# Patient Record
Sex: Female | Born: 1990 | Race: White | Hispanic: No | Marital: Married | Smoking: Never smoker
Health system: Southern US, Community
[De-identification: ages and names within clinical notes are randomized; demographics above are authoritative.]

## PROBLEM LIST (undated history)

## (undated) ENCOUNTER — Inpatient Hospital Stay (HOSPITAL_COMMUNITY): Payer: Self-pay

## (undated) DIAGNOSIS — M419 Scoliosis, unspecified: Secondary | ICD-10-CM

## (undated) DIAGNOSIS — Z789 Other specified health status: Secondary | ICD-10-CM

## (undated) HISTORY — PX: OTHER SURGICAL HISTORY: SHX169

---

## 2013-10-04 LAB — OB RESULTS CONSOLE ABO/RH: RH Type: POSITIVE

## 2013-10-04 LAB — OB RESULTS CONSOLE RUBELLA ANTIBODY, IGM: RUBELLA: IMMUNE

## 2013-10-04 LAB — OB RESULTS CONSOLE GC/CHLAMYDIA
CHLAMYDIA, DNA PROBE: NEGATIVE
Gonorrhea: NEGATIVE

## 2013-10-04 LAB — OB RESULTS CONSOLE RPR: RPR: NONREACTIVE

## 2013-10-04 LAB — OB RESULTS CONSOLE HIV ANTIBODY (ROUTINE TESTING): HIV: NONREACTIVE

## 2013-10-04 LAB — OB RESULTS CONSOLE HEPATITIS B SURFACE ANTIGEN: Hepatitis B Surface Ag: NEGATIVE

## 2013-10-04 LAB — OB RESULTS CONSOLE ANTIBODY SCREEN: ANTIBODY SCREEN: NEGATIVE

## 2013-10-31 NOTE — L&D Delivery Note (Signed)
Delivery Note  SVD viable female Apgars 8,9 over intact perineum but small vaginal lac.  Placenta delivered spontaneously intact with 3VC. Repair with 3-0 Chromic with good support and hemostasis noted and R/V exam confirms.  PH art was sent.  [place] cord blood was not done.  Mother and baby were doing well.  EBL 300cc  Candice Campavid Lowe, MD

## 2014-03-31 LAB — OB RESULTS CONSOLE GBS: STREP GROUP B AG: NEGATIVE

## 2014-04-09 ENCOUNTER — Encounter (HOSPITAL_COMMUNITY): Payer: Self-pay | Admitting: *Deleted

## 2014-04-09 ENCOUNTER — Inpatient Hospital Stay (HOSPITAL_COMMUNITY)
Admission: AD | Admit: 2014-04-09 | Discharge: 2014-04-09 | Disposition: A | Discharge status: Home / Self Care | Payer: BC Managed Care – PPO | Source: Ambulatory Visit | Attending: Obstetrics and Gynecology | Admitting: Obstetrics and Gynecology

## 2014-04-09 DIAGNOSIS — O9989 Other specified diseases and conditions complicating pregnancy, childbirth and the puerperium: Principal | ICD-10-CM

## 2014-04-09 DIAGNOSIS — Z833 Family history of diabetes mellitus: Secondary | ICD-10-CM | POA: Insufficient documentation

## 2014-04-09 DIAGNOSIS — O99891 Other specified diseases and conditions complicating pregnancy: Secondary | ICD-10-CM | POA: Insufficient documentation

## 2014-04-09 DIAGNOSIS — IMO0001 Reserved for inherently not codable concepts without codable children: Secondary | ICD-10-CM

## 2014-04-09 DIAGNOSIS — Z87891 Personal history of nicotine dependence: Secondary | ICD-10-CM | POA: Insufficient documentation

## 2014-04-09 DIAGNOSIS — R030 Elevated blood-pressure reading, without diagnosis of hypertension: Secondary | ICD-10-CM | POA: Insufficient documentation

## 2014-04-09 HISTORY — DX: Other specified health status: Z78.9

## 2014-04-09 LAB — COMPREHENSIVE METABOLIC PANEL
ALBUMIN: 2.4 g/dL — AB (ref 3.5–5.2)
ALK PHOS: 131 U/L — AB (ref 39–117)
ALT: 27 U/L (ref 0–35)
AST: 25 U/L (ref 0–37)
BUN: 11 mg/dL (ref 6–23)
CHLORIDE: 103 meq/L (ref 96–112)
CO2: 23 mEq/L (ref 19–32)
Calcium: 9 mg/dL (ref 8.4–10.5)
Creatinine, Ser: 0.82 mg/dL (ref 0.50–1.10)
GFR calc Af Amer: >90 mL/min (ref >90)
GFR calc non Af Amer: >90 mL/min (ref >90)
Glucose, Bld: 78 mg/dL (ref 70–99)
POTASSIUM: 4 meq/L (ref 3.7–5.3)
SODIUM: 137 meq/L (ref 137–147)
TOTAL PROTEIN: 6.1 g/dL (ref 6.0–8.3)

## 2014-04-09 LAB — CBC
HEMATOCRIT: 33.6 % — AB (ref 36.0–46.0)
Hemoglobin: 11.5 g/dL — ABNORMAL LOW (ref 12.0–15.0)
MCH: 30 pg (ref 26.0–34.0)
MCHC: 34.2 g/dL (ref 30.0–36.0)
MCV: 87.7 fL (ref 78.0–100.0)
PLATELETS: 164 10*3/uL (ref 150–400)
RBC: 3.83 MIL/uL — ABNORMAL LOW (ref 3.87–5.11)
RDW: 12.5 % (ref 11.5–15.5)
WBC: 8.9 10*3/uL (ref 4.0–10.5)

## 2014-04-09 LAB — URINALYSIS, ROUTINE W REFLEX MICROSCOPIC
Bilirubin Urine: NEGATIVE
Glucose, UA: 100 mg/dL — AB
Hgb urine dipstick: NEGATIVE
KETONES UR: NEGATIVE mg/dL
LEUKOCYTES UA: NEGATIVE
Nitrite: NEGATIVE
PH: 6 (ref 5.0–8.0)
Protein, ur: NEGATIVE mg/dL
SPECIFIC GRAVITY, URINE: 1.01 (ref 1.005–1.030)
Urobilinogen, UA: 0.2 mg/dL (ref 0.0–1.0)

## 2014-04-09 LAB — PROTEIN / CREATININE RATIO, URINE
Creatinine, Urine: 104.37 mg/dL
PROTEIN CREATININE RATIO: 0.13 (ref 0.00–0.15)
TOTAL PROTEIN, URINE: 13.6 mg/dL

## 2014-04-09 LAB — LACTATE DEHYDROGENASE: LDH: 227 U/L (ref 94–250)

## 2014-04-09 LAB — URIC ACID: Uric Acid, Serum: 5.5 mg/dL (ref 2.4–7.0)

## 2014-04-09 NOTE — MAU Provider Note (Signed)
History     CSN: [id number]  Arrival date and time: 04/09/14 1622   First Provider Initiated Contact with Patient 04/09/14 1747      Chief Complaint  Patient presents with  . Hypertension   HPI Ms. Lindsay West is a 23 y.o. G2P0 at [redacted]w[redacted]d who presents to MAU today from the office for further evaluation of elevated BP. The patient states BP in the office was 120s/90. She states very mild headache. She denies blurred vision, floaters, RUQ pain, vaginal bleeding or LOF. She states mild LE edema and good fetal movement  OB History   Grav Para Term Preterm Abortions TAB SAB Ect Mult Living   2               Past Medical History  Diagnosis Date  . Medical history non-contributory     Past Surgical History  Procedure Laterality Date  . No past surgeries      Family History  Problem Relation Age of Onset  . Diabetes Paternal Grandmother     History  Substance Use Topics  . Smoking status: Not on file  . Smokeless tobacco: Not on file  . Alcohol Use: Not on file    Allergies: No Known Allergies  Prescriptions prior to admission  Medication Sig Dispense Refill  . acetaminophen (TYLENOL) 325 MG tablet Take 650 mg by mouth every 6 (six) hours as needed for headache.      . ondansetron (ZOFRAN) 8 MG tablet Take 4 mg by mouth every 8 (eight) hours as needed for nausea or vomiting. Patient states that she takes 1/2 tablet of 8 mg dose at times.        Review of Systems  Eyes: Negative for blurred vision.  Gastrointestinal: Negative for abdominal pain.  Genitourinary:       Neg - vaginal bleeding, discharge, LOF  Neurological: Positive for headaches.   Physical Exam   Blood pressure 126/88, pulse 94, last menstrual period 07/08/2013.  Physical Exam  Constitutional: She is oriented to person, place, and time. She appears well-developed and well-nourished. She appears distressed.  HENT:  Head: Normocephalic and atraumatic.  Cardiovascular: Normal rate, regular rhythm  and normal heart sounds.   Respiratory: Effort normal and breath sounds normal. No respiratory distress.  GI: Soft. She exhibits no distension and no mass. There is tenderness (mild diffuse tenderness to palpation). There is no rebound and no guarding.  Musculoskeletal: She exhibits edema (mild non-pitting edema bilaterally).  Neurological: She is alert and oriented to person, place, and time. She has normal reflexes.  No clonus  Skin: Skin is warm and dry. No erythema.  Psychiatric: She has a normal mood and affect.   Results for orders placed during the hospital encounter of 04/09/14 (from the past 24 hour(s))  URINALYSIS, ROUTINE W REFLEX MICROSCOPIC     Status: Abnormal   Collection Time    04/09/14  4:45 PM      Result Value Ref Range   Color, Urine YELLOW  YELLOW   APPearance CLEAR  CLEAR   Specific Gravity, Urine 1.010  1.005 - 1.030   pH 6.0  5.0 - 8.0   Glucose, UA 100 (*) NEGATIVE mg/dL   Hgb urine dipstick NEGATIVE  NEGATIVE   Bilirubin Urine NEGATIVE  NEGATIVE   Ketones, ur NEGATIVE  NEGATIVE mg/dL   Protein, ur NEGATIVE  NEGATIVE mg/dL   Urobilinogen, UA 0.2  0.0 - 1.0 mg/dL   Nitrite NEGATIVE  NEGATIVE   Leukocytes, UA NEGATIVE  NEGATIVE  PROTEIN / CREATININE RATIO, URINE     Status: None   Collection Time    04/09/14  4:45 PM      Result Value Ref Range   Creatinine, Urine 104.37     Total Protein, Urine 13.6     PROTEIN CREATININE RATIO 0.13  0.00 - 0.15  CBC     Status: Abnormal   Collection Time    04/09/14  5:10 PM      Result Value Ref Range   WBC 8.9  4.0 - 10.5 K/uL   RBC 3.83 (*) 3.87 - 5.11 MIL/uL   Hemoglobin 11.5 (*) 12.0 - 15.0 g/dL   HCT 08.633.6 (*) 57.836.0 - 46.946.0 %   MCV 87.7  78.0 - 100.0 fL   MCH 30.0  26.0 - 34.0 pg   MCHC 34.2  30.0 - 36.0 g/dL   RDW 62.912.5  52.811.5 - 41.315.5 %   Platelets 164  150 - 400 K/uL  COMPREHENSIVE METABOLIC PANEL     Status: Abnormal   Collection Time    04/09/14  5:10 PM      Result Value Ref Range   Sodium 137  137 -  147 mEq/L   Potassium 4.0  3.7 - 5.3 mEq/L   Chloride 103  96 - 112 mEq/L   CO2 23  19 - 32 mEq/L   Glucose, Bld 78  70 - 99 mg/dL   BUN 11  6 - 23 mg/dL   Creatinine, Ser 2.440.82  0.50 - 1.10 mg/dL   Calcium 9.0  8.4 - 01.010.5 mg/dL   Total Protein 6.1  6.0 - 8.3 g/dL   Albumin 2.4 (*) 3.5 - 5.2 g/dL   AST 25  0 - 37 U/L   ALT 27  0 - 35 U/L   Alkaline Phosphatase 131 (*) 39 - 117 U/L   Total Bilirubin <0.2 (*) 0.3 - 1.2 mg/dL   GFR calc non Af Amer >90  >90 mL/min   GFR calc Af Amer >90  >90 mL/min  URIC ACID     Status: None   Collection Time    04/09/14  5:10 PM      Result Value Ref Range   Uric Acid, Serum 5.5  2.4 - 7.0 mg/dL  LACTATE DEHYDROGENASE     Status: None   Collection Time    04/09/14  5:10 PM      Result Value Ref Range   LDH 227  94 - 250 U/L    Patient Vitals for the past 24 hrs:  BP Pulse  04/09/14 1747 126/88 mmHg 94  04/09/14 1732 127/73 mmHg 76  04/09/14 1717 130/86 mmHg 74  04/09/14 1703 135/78 mmHg 79  04/09/14 1701 134/90 mmHg 76    MAU Course  Procedures None  MDM Dr. Henderson Cloudomblin in MAU   Assessment and Plan  A: SIUP at 8034w2d Mildly elevated BP in pregnancy  P: Dr. Henderson Cloudomblin in MAU, discharged patient home  Freddi StarrJulie N Ethier, PA-C  04/09/2014, 6:15 PM

## 2014-04-09 NOTE — Progress Notes (Signed)
Pt states she has headache that she rates about 3-4 . Pt states she had this type of headache in her first trimester, and "it kinda came back"

## 2014-04-09 NOTE — MAU Note (Signed)
Pt was seen in the MD's office, pt was sent over for further evaluation

## 2014-04-09 NOTE — Discharge Instructions (Signed)
Hypertension During Pregnancy °Hypertension is also called high blood pressure. Blood pressure moves blood in your body. Sometimes, the force that moves the blood becomes too strong. When you are pregnant, this condition should be watched carefully. It can cause problems for you and your baby. °HOME CARE  °· Make and keep all of your doctor visits. °· Take medicine as told by your doctor. Tell your doctor about all medicines you take. °· Eat very little salt. °· Exercise regularly. °· Do not drink alcohol. °· Do not smoke. °· Do not have drinks with caffeine. °· Lie on your left side when resting. °GET HELP RIGHT AWAY IF: °· You have bad belly (abdominal) pain. °· You have sudden puffiness (swelling) in the hands, ankles, or face. °· You gain 4 pounds (1.8 kilograms) or more in 1 week. °· You throw up (vomit) repeatedly. °· You have bleeding from the vagina. °· You do not feel the baby moving as much. °· You have a headache. °· You have blurred or double vision. °· You have muscle twitching or spasms. °· You have shortness of breath. °· You have blue fingernails and lips. °· You have blood in your pee (urine). °MAKE SURE YOU: °· Understand these instructions. °· Will watch your condition. °· Will get help right away if you are not doing well or get worse. °Document Released: 11/19/2010 Document Revised: 08/07/2013 Document Reviewed: 05/16/2013 °[employer]® Patient Information ©2014 [employer]. ° °

## 2014-04-09 NOTE — Progress Notes (Signed)
In office today BP up some and some protein in urine. BPP in office 6/8. Today no HA or vision change, no epigastric pain.  Filed Vitals:   04/09/14 1747  BP: 126/88  Pulse: 94    FHT reactive    (BPP = 8/10)  Results for orders placed during the hospital encounter of 04/09/14 (from the past 24 hour(s))  URINALYSIS, ROUTINE W REFLEX MICROSCOPIC     Status: Abnormal   Collection Time    04/09/14  4:45 PM      Result Value Ref Range   Color, Urine YELLOW  YELLOW   APPearance CLEAR  CLEAR   Specific Gravity, Urine 1.010  1.005 - 1.030   pH 6.0  5.0 - 8.0   Glucose, UA 100 (*) NEGATIVE mg/dL   Hgb urine dipstick NEGATIVE  NEGATIVE   Bilirubin Urine NEGATIVE  NEGATIVE   Ketones, ur NEGATIVE  NEGATIVE mg/dL   Protein, ur NEGATIVE  NEGATIVE mg/dL   Urobilinogen, UA 0.2  0.0 - 1.0 mg/dL   Nitrite NEGATIVE  NEGATIVE   Leukocytes, UA NEGATIVE  NEGATIVE  PROTEIN / CREATININE RATIO, URINE     Status: None   Collection Time    04/09/14  4:45 PM      Result Value Ref Range   Creatinine, Urine 104.37     Total Protein, Urine 13.6     PROTEIN CREATININE RATIO 0.13  0.00 - 0.15  CBC     Status: Abnormal   Collection Time    04/09/14  5:10 PM      Result Value Ref Range   WBC 8.9  4.0 - 10.5 K/uL   RBC 3.83 (*) 3.87 - 5.11 MIL/uL   Hemoglobin 11.5 (*) 12.0 - 15.0 g/dL   HCT 81.133.6 (*) 91.436.0 - 78.246.0 %   MCV 87.7  78.0 - 100.0 fL   MCH 30.0  26.0 - 34.0 pg   MCHC 34.2  30.0 - 36.0 g/dL   RDW 95.612.5  21.311.5 - 08.615.5 %   Platelets 164  150 - 400 K/uL  COMPREHENSIVE METABOLIC PANEL     Status: Abnormal   Collection Time    04/09/14  5:10 PM      Result Value Ref Range   Sodium 137  137 - 147 mEq/L   Potassium 4.0  3.7 - 5.3 mEq/L   Chloride 103  96 - 112 mEq/L   CO2 23  19 - 32 mEq/L   Glucose, Bld 78  70 - 99 mg/dL   BUN 11  6 - 23 mg/dL   Creatinine, Ser 5.780.82  0.50 - 1.10 mg/dL   Calcium 9.0  8.4 - 46.910.5 mg/dL   Total Protein 6.1  6.0 - 8.3 g/dL   Albumin 2.4 (*) 3.5 - 5.2 g/dL   AST 25   0 - 37 U/L   ALT 27  0 - 35 U/L   Alkaline Phosphatase 131 (*) 39 - 117 U/L   Total Bilirubin <0.2 (*) 0.3 - 1.2 mg/dL   GFR calc non Af Amer >90  >90 mL/min   GFR calc Af Amer >90  >90 mL/min  URIC ACID     Status: None   Collection Time    04/09/14  5:10 PM      Result Value Ref Range   Uric Acid, Serum 5.5  2.4 - 7.0 mg/dL  LACTATE DEHYDROGENASE     Status: None   Collection Time    04/09/14  5:10 PM  Result Value Ref Range   LDH 227  94 - 250 U/L    A: Satisfactory  P: Preeclampsia warnings given      Rest encouraged      FU office 5-6 days

## 2014-04-11 ENCOUNTER — Encounter (HOSPITAL_COMMUNITY): Payer: Self-pay | Admitting: *Deleted

## 2014-04-11 ENCOUNTER — Inpatient Hospital Stay (HOSPITAL_COMMUNITY)
Admission: AD | Admit: 2014-04-11 | Discharge: 2014-04-11 | Disposition: A | Discharge status: Home / Self Care | Payer: BC Managed Care – PPO | Source: Ambulatory Visit | Attending: Obstetrics and Gynecology | Admitting: Obstetrics and Gynecology

## 2014-04-11 DIAGNOSIS — O133 Gestational [pregnancy-induced] hypertension without significant proteinuria, third trimester: Secondary | ICD-10-CM

## 2014-04-11 DIAGNOSIS — Z833 Family history of diabetes mellitus: Secondary | ICD-10-CM | POA: Insufficient documentation

## 2014-04-11 DIAGNOSIS — O139 Gestational [pregnancy-induced] hypertension without significant proteinuria, unspecified trimester: Secondary | ICD-10-CM | POA: Insufficient documentation

## 2014-04-11 DIAGNOSIS — O26893 Other specified pregnancy related conditions, third trimester: Secondary | ICD-10-CM

## 2014-04-11 DIAGNOSIS — R51 Headache: Secondary | ICD-10-CM

## 2014-04-11 LAB — URINALYSIS, ROUTINE W REFLEX MICROSCOPIC
BILIRUBIN URINE: NEGATIVE
GLUCOSE, UA: 250 mg/dL — AB
HGB URINE DIPSTICK: NEGATIVE
Ketones, ur: NEGATIVE mg/dL
Leukocytes, UA: NEGATIVE
Nitrite: NEGATIVE
PH: 6 (ref 5.0–8.0)
Protein, ur: NEGATIVE mg/dL
SPECIFIC GRAVITY, URINE: 1.02 (ref 1.005–1.030)
Urobilinogen, UA: 0.2 mg/dL (ref 0.0–1.0)

## 2014-04-11 NOTE — MAU Note (Signed)
Pt states here for eval of headache and blurred vision. Was seen in MAU Wednesday for PIH eval.

## 2014-04-11 NOTE — MAU Provider Note (Signed)
History     CSN: [id number]  Arrival date and time: 04/11/14 1458   None     Chief Complaint  Patient presents with  . Headache  . Blurred Vision   HPI Lindsay West is 23 y.o. G1P0 6846w2d weeks --called office with complaint of headache preceded by blurred vision.  This is her first pregnancy.  She is a patient of Dr. Lisbeth PlyMcComb's.  Pregnancy was without complication until 2 days ago at her scheduled appt she had swelling of her feet and  her BP was elevated.  Sent here for PIH eval.  Labs were wnl.  She was instructed to call for sxs of PIH.  Headache and blurred vision occurred yesterday too.  At this time the headache is less since taking Tylenol 2 tabs at 1 oclock.  No further blurred vision.  When these sxs occur they last about 1 1/2 hrs.  Denies chest pain, vaginal bleeding, LOF.    Past Medical History  Diagnosis Date  . Medical history non-contributory     Past Surgical History  Procedure Laterality Date  . No past surgeries      Family History  Problem Relation Age of Onset  . Diabetes Paternal Grandmother     History  Substance Use Topics  . Smoking status: Never Smoker   . Smokeless tobacco: Not on file  . Alcohol Use: No    Allergies: No Known Allergies  Prescriptions prior to admission  Medication Sig Dispense Refill  . acetaminophen (TYLENOL) 325 MG tablet Take 650 mg by mouth every 6 (six) hours as needed for headache.      . ondansetron (ZOFRAN) 8 MG tablet Take 4 mg by mouth every 8 (eight) hours as needed for nausea or vomiting. Patient states that she takes 1/2 tablet of 8 mg dose at times.        Review of Systems  Cardiovascular:       Mild swelling in feet  Gastrointestinal: Negative for abdominal pain.  Genitourinary:       Neg for vaginal bleeding, loss of fluid  Neurological: Positive for headaches.   Physical Exam   Blood pressure 132/88, pulse 78, resp. rate 18, height 5\' 6"  (1.676 m), weight 183 lb 8 oz (83.235 kg), last menstrual period  07/08/2013.  Physical Exam  Constitutional: She is oriented to person, place, and time. She appears well-developed and well-nourished. No distress.  Musculoskeletal: She exhibits edema (mild swelling).  Neurological: She is alert and oriented to person, place, and time. She displays normal reflexes. Coordination normal.  Skin: Skin is warm and dry.  Psychiatric: She has a normal mood and affect. Her behavior is normal.   Results for orders placed during the hospital encounter of 04/11/14 (from the past 24 hour(s))  URINALYSIS, ROUTINE W REFLEX MICROSCOPIC     Status: Abnormal   Collection Time    04/11/14  3:12 PM      Result Value Ref Range   Color, Urine YELLOW  YELLOW   APPearance CLEAR  CLEAR   Specific Gravity, Urine 1.020  1.005 - 1.030   pH 6.0  5.0 - 8.0   Glucose, UA 250 (*) NEGATIVE mg/dL   Hgb urine dipstick NEGATIVE  NEGATIVE   Bilirubin Urine NEGATIVE  NEGATIVE   Ketones, ur NEGATIVE  NEGATIVE mg/dL   Protein, ur NEGATIVE  NEGATIVE mg/dL   Urobilinogen, UA 0.2  0.0 - 1.0 mg/dL   Nitrite NEGATIVE  NEGATIVE   Leukocytes, UA NEGATIVE  NEGATIVE  MAU Course  Procedures   NST baseline 130, REACTIVE,  Neg for contractions.  MDM Reported MSE, urine, and NST to Dr. Marcelle OverlieHolland.  Patient's labs were negative on 6/10.  Per Dr. Marcelle OverlieHolland, labs do not need to be repeated. Instruct patient to rest over the weekend, stay well hydrated, and keep scheduled appointment.  To call MD if sxs worsen.  Her husband has been taking BPs at home--will report to MD on call if they are elevated  Assessment and Plan  A:  Headache at 4632w2d gestation      Gestational hypertension      Reactive NST  P:  Patient was instructed to rest over the weekend, stay well hydrated and keep scheduled appointment for Monday.  She is to call for worsening sxs   Kinaya Hilliker,EVE M 04/11/2014, 3:30 PM

## 2014-04-11 NOTE — Discharge Instructions (Signed)
Third Trimester of Pregnancy °The third trimester is from week 29 through week 42, months 7 through 9. The third trimester is a time when the fetus is growing rapidly. At the end of the ninth month, the fetus is about 20 inches in length and weighs 6 10 pounds.  °BODY CHANGES °Your body goes through many changes during pregnancy. The changes vary from woman to woman.  °· Your weight will continue to increase. You can expect to gain 25 35 pounds (11 16 kg) by the end of the pregnancy. °· You may begin to get stretch marks on your hips, abdomen, and breasts. °· You may urinate more often because the fetus is moving lower into your pelvis and pressing on your bladder. °· You may develop or continue to have heartburn as a result of your pregnancy. °· You may develop constipation because certain hormones are causing the muscles that push waste through your intestines to slow down. °· You may develop hemorrhoids or swollen, bulging veins (varicose veins). °· You may have pelvic pain because of the weight gain and pregnancy hormones relaxing your joints between the bones in your pelvis. Back aches may result from over exertion of the muscles supporting your posture. °· Your breasts will continue to grow and be tender. A yellow discharge may leak from your breasts called colostrum. °· Your belly button may stick out. °· You may feel short of breath because of your expanding uterus. °· You may notice the fetus "dropping," or moving lower in your abdomen. °· You may have a bloody mucus discharge. This usually occurs a few days to a week before labor begins. °· Your cervix becomes thin and soft (effaced) near your due date. °WHAT TO EXPECT AT YOUR PRENATAL EXAMS  °You will have prenatal exams every 2 weeks until week 36. Then, you will have weekly prenatal exams. During a routine prenatal visit: °· You will be weighed to make sure you and the fetus are growing normally. °· Your blood pressure is taken. °· Your abdomen will be  measured to track your baby's growth. °· The fetal heartbeat will be listened to. °· Any test results from the previous visit will be discussed. °· You may have a cervical check near your due date to see if you have effaced. °At around 36 weeks, your caregiver will check your cervix. At the same time, your caregiver will also perform a test on the secretions of the vaginal tissue. This test is to determine if a type of bacteria, Group B streptococcus, is present. Your caregiver will explain this further. °Your caregiver may ask you: °· What your birth plan is. °· How you are feeling. °· If you are feeling the baby move. °· If you have had any abnormal symptoms, such as leaking fluid, bleeding, severe headaches, or abdominal cramping. °· If you have any questions. °Other tests or screenings that may be performed during your third trimester include: °· Blood tests that check for low iron levels (anemia). °· Fetal testing to check the health, activity level, and growth of the fetus. Testing is done if you have certain medical conditions or if there are problems during the pregnancy. °FALSE LABOR °You may feel small, irregular contractions that eventually go away. These are called Braxton Hicks contractions, or false labor. Contractions may last for hours, days, or even weeks before true labor sets in. If contractions come at regular intervals, intensify, or become painful, it is best to be seen by your caregiver.  °  SIGNS OF LABOR  °· Menstrual-like cramps. °· Contractions that are 5 minutes apart or less. °· Contractions that start on the top of the uterus and spread down to the lower abdomen and back. °· A sense of increased pelvic pressure or back pain. °· A watery or bloody mucus discharge that comes from the vagina. °If you have any of these signs before the 37th week of pregnancy, call your caregiver right away. You need to go to the hospital to get checked immediately. °HOME CARE INSTRUCTIONS  °· Avoid all  smoking, herbs, alcohol, and unprescribed drugs. These chemicals affect the formation and growth of the baby. °· Follow your caregiver's instructions regarding medicine use. There are medicines that are either safe or unsafe to take during pregnancy. °· Exercise only as directed by your caregiver. Experiencing uterine cramps is a good sign to stop exercising. °· Continue to eat regular, healthy meals. °· Wear a good support bra for breast tenderness. °· Do not use hot tubs, steam rooms, or saunas. °· Wear your seat belt at all times when driving. °· Avoid raw meat, uncooked cheese, cat litter boxes, and soil used by cats. These carry germs that can cause birth defects in the baby. °· Take your prenatal vitamins. °· Try taking a stool softener (if your caregiver approves) if you develop constipation. Eat more high-fiber foods, such as fresh vegetables or fruit and whole grains. Drink plenty of fluids to keep your urine clear or pale yellow. °· Take warm sitz baths to soothe any pain or discomfort caused by hemorrhoids. Use hemorrhoid cream if your caregiver approves. °· If you develop varicose veins, wear support hose. Elevate your feet for 15 minutes, 3 4 times a day. Limit salt in your diet. °· Avoid heavy lifting, wear low heal shoes, and practice good posture. °· Rest a lot with your legs elevated if you have leg cramps or low back pain. °· Visit your dentist if you have not gone during your pregnancy. Use a soft toothbrush to brush your teeth and be gentle when you floss. °· A sexual relationship may be continued unless your caregiver directs you otherwise. °· Do not travel far distances unless it is absolutely necessary and only with the approval of your caregiver. °· Take prenatal classes to understand, practice, and ask questions about the labor and delivery. °· Make a trial run to the hospital. °· Pack your hospital bag. °· Prepare the baby's nursery. °· Continue to go to all your prenatal visits as directed  by your caregiver. °SEEK MEDICAL CARE IF: °· You are unsure if you are in labor or if your water has broken. °· You have dizziness. °· You have mild pelvic cramps, pelvic pressure, or nagging pain in your abdominal area. °· You have persistent nausea, vomiting, or diarrhea. °· You have a bad smelling vaginal discharge. °· You have pain with urination. °SEEK IMMEDIATE MEDICAL CARE IF:  °· You have a fever. °· You are leaking fluid from your vagina. °· You have spotting or bleeding from your vagina. °· You have severe abdominal cramping or pain. °· You have rapid weight loss or gain. °· You have shortness of breath with chest pain. °· You notice sudden or extreme swelling of your face, hands, ankles, feet, or legs. °· You have not felt your baby move in over an hour. °· You have severe headaches that do not go away with medicine. °· You have vision changes. °Document Released: 10/11/2001 Document Revised: 06/19/2013 Document Reviewed:   12/18/2012 °ExitCare® Patient Information ©2014 [employer]. °Fetal Movement Counts °Patient Name: __________________________________________________ Patient Due Date: ____________________ °Performing a fetal movement count is highly recommended in high-risk pregnancies, but it is good for every pregnant woman to do. Your caregiver may ask you to start counting fetal movements at 28 weeks of the pregnancy. Fetal movements often increase: °· After eating a full meal. °· After physical activity. °· After eating or drinking something sweet or cold. °· At rest. °Pay attention to when you feel the baby is most active. This will help you notice a pattern of your baby's sleep and wake cycles and what factors contribute to an increase in fetal movement. It is important to perform a fetal movement count at the same time each day when your baby is normally most active.  °HOW TO COUNT FETAL MOVEMENTS °1. Find a quiet and comfortable area to sit or lie down on your left side. Lying on your left  side provides the best blood and oxygen circulation to your baby. °2. Write down the day and time on a sheet of paper or in a journal. °3. Start counting kicks, flutters, swishes, rolls, or jabs in a 2 hour period. You should feel at least 10 movements within 2 hours. °4. If you do not feel 10 movements in 2 hours, wait 2 3 hours and count again. Look for a change in the pattern or not enough counts in 2 hours. °SEEK MEDICAL CARE IF: °· You feel less than 10 counts in 2 hours, tried twice. °· There is no movement in over an hour. °· The pattern is changing or taking longer each day to reach 10 counts in 2 hours. °· You feel the baby is not moving as he or she usually does. °Date: ____________ Movements: ____________ Start time: ____________ Finish time: ____________  °Date: ____________ Movements: ____________ Start time: ____________ Finish time: ____________ °Date: ____________ Movements: ____________ Start time: ____________ Finish time: ____________ °Date: ____________ Movements: ____________ Start time: ____________ Finish time: ____________ °Date: ____________ Movements: ____________ Start time: ____________ Finish time: ____________ °Date: ____________ Movements: ____________ Start time: ____________ Finish time: ____________ °Date: ____________ Movements: ____________ Start time: ____________ Finish time: ____________ °Date: ____________ Movements: ____________ Start time: ____________ Finish time: ____________  °Date: ____________ Movements: ____________ Start time: ____________ Finish time: ____________ °Date: ____________ Movements: ____________ Start time: ____________ Finish time: ____________ °Date: ____________ Movements: ____________ Start time: ____________ Finish time: ____________ °Date: ____________ Movements: ____________ Start time: ____________ Finish time: ____________ °Date: ____________ Movements: ____________ Start time: ____________ Finish time: ____________ °Date: ____________ Movements:  ____________ Start time: ____________ Finish time: ____________ °Date: ____________ Movements: ____________ Start time: ____________ Finish time: ____________  °Date: ____________ Movements: ____________ Start time: ____________ Finish time: ____________ °Date: ____________ Movements: ____________ Start time: ____________ Finish time: ____________ °Date: ____________ Movements: ____________ Start time: ____________ Finish time: ____________ °Date: ____________ Movements: ____________ Start time: ____________ Finish time: ____________ °Date: ____________ Movements: ____________ Start time: ____________ Finish time: ____________ °Date: ____________ Movements: ____________ Start time: ____________ Finish time: ____________ °Date: ____________ Movements: ____________ Start time: ____________ Finish time: ____________  °Date: ____________ Movements: ____________ Start time: ____________ Finish time: ____________ °Date: ____________ Movements: ____________ Start time: ____________ Finish time: ____________ °Date: ____________ Movements: ____________ Start time: ____________ Finish time: ____________ °Date: ____________ Movements: ____________ Start time: ____________ Finish time: ____________ °Date: ____________ Movements: ____________ Start time: ____________ Finish time: ____________ °Date: ____________ Movements: ____________ Start time: ____________ Finish time: ____________ °Date: ____________ Movements: ____________ Start time: ____________ Finish time: ____________  °Date:   ____________ Movements: ____________ Start time: ____________ Doreatha MartinFinish time: ____________ Date: ____________ Movements: ____________ Start time: ____________ Doreatha MartinFinish time: ____________ Date: ____________ Movements: ____________ Start time: ____________ Doreatha MartinFinish time: ____________ Date: ____________ Movements: ____________ Start time: ____________ Doreatha MartinFinish time: ____________ Date: ____________ Movements: ____________ Start time: ____________ Doreatha MartinFinish  time: ____________ Date: ____________ Movements: ____________ Start time: ____________ Doreatha MartinFinish time: ____________ Date: ____________ Movements: ____________ Start time: ____________ Doreatha MartinFinish time: ____________  Date: ____________ Movements: ____________ Start time: ____________ Doreatha MartinFinish time: ____________ Date: ____________ Movements: ____________ Start time: ____________ Doreatha MartinFinish time: ____________ Date: ____________ Movements: ____________ Start time: ____________ Doreatha MartinFinish time: ____________ Date: ____________ Movements: ____________ Start time: ____________ Doreatha MartinFinish time: ____________ Date: ____________ Movements: ____________ Start time: ____________ Doreatha MartinFinish time: ____________ Date: ____________ Movements: ____________ Start time: ____________ Doreatha MartinFinish time: ____________ Date: ____________ Movements: ____________ Start time: ____________ Doreatha MartinFinish time: ____________  Date: ____________ Movements: ____________ Start time: ____________ Doreatha MartinFinish time: ____________ Date: ____________ Movements: ____________ Start time: ____________ Doreatha MartinFinish time: ____________ Date: ____________ Movements: ____________ Start time: ____________ Doreatha MartinFinish time: ____________ Date: ____________ Movements: ____________ Start time: ____________ Doreatha MartinFinish time: ____________ Date: ____________ Movements: ____________ Start time: ____________ Doreatha MartinFinish time: ____________ Date: ____________ Movements: ____________ Start time: ____________ Doreatha MartinFinish time: ____________ Date: ____________ Movements: ____________ Start time: ____________ Doreatha MartinFinish time: ____________  Date: ____________ Movements: ____________ Start time: ____________ Doreatha MartinFinish time: ____________ Date: ____________ Movements: ____________ Start time: ____________ Doreatha MartinFinish time: ____________ Date: ____________ Movements: ____________ Start time: ____________ Doreatha MartinFinish time: ____________ Date: ____________ Movements: ____________ Start time: ____________ Doreatha MartinFinish time: ____________ Date: ____________  Movements: ____________ Start time: ____________ Doreatha MartinFinish time: ____________ Date: ____________ Movements: ____________ Start time: ____________ Doreatha MartinFinish time: ____________ Document Released: 11/16/2006 Document Revised: 10/03/2012 Document Reviewed: 08/13/2012 [employer] Patient Information 2014 [employer]. Hypertension During Pregnancy Hypertension is also called high blood pressure. It can occur at any time in life and during pregnancy. When you have hypertension, there is extra pressure inside your blood vessels that carry blood from the heart to the rest of your body (arteries). Hypertension during pregnancy can cause problems for you and your baby. Your baby might not weigh as much as it should at birth or might be born early (premature). Very bad cases of hypertension during pregnancy can be life threatening.  Different types of hypertension can occur during pregnancy.   Chronic hypertension. This happens when a woman has hypertension before pregnancy and it continues during pregnancy.  Gestational hypertension. This is when hypertension develops during pregnancy.  Preeclampsia or toxemia of pregnancy. This is a very serious type of hypertension that develops only during pregnancy. It is a disease that affects the whole body (systemic) and can be very dangerous for both mother and baby.  Gestational hypertension and preeclampsia usually go away after your baby is born. Blood pressure generally stabilizes within 6 weeks. Women who have hypertension during pregnancy have a greater chance of developing hypertension later in life or with future pregnancies. RISK FACTORS Some factors make you more likely to develop hypertension during pregnancy. Risk factors include:  Having hypertension before pregnancy.  Having hypertension during a previous pregnancy.  Being overweight.  Being older than 40.  Being pregnant with more than one baby (multiples).  Having diabetes or kidney  problems. SIGNS AND SYMPTOMS Chronic and gestational hypertension rarely cause symptoms. Preeclampsia has symptoms, which may include:  Increased protein in your urine. Your health care provider will check for this at every prenatal visit.  Swelling of your hands and face.  Rapid weight gain.  Headaches.  Visual changes.  Being bothered by light.  Abdominal  pain, especially in the right upper area.  Chest pain.  Shortness of breath.  Increased reflexes.  Seizures. Seizures occur with a more severe form of preeclampsia, called eclampsia. DIAGNOSIS   You may be diagnosed with hypertension during a regular prenatal exam. At each visit, tests may include:  Blood pressure checks.  A urine test to check for protein in your urine.  The type of hypertension you are diagnosed with depends on when you developed it. It also depends on your specific blood pressure reading.  Developing hypertension before 20 weeks of pregnancy is consistent with chronic hypertension.  Developing hypertension after 20 weeks of pregnancy is consistent with gestational hypertension.  Hypertension with increased urinary protein is diagnosed as preeclampsia.  Blood pressure measurements that stay above 160 systolic or 110 diastolic are a sign of severe preeclampsia. TREATMENT Treatment for hypertension during pregnancy varies. Treatment depends on the type of hypertension and how serious it is.  If you take medicine for chronic hypertension, you may need to switch medicines.  Drugs called ACE inhibitors should not be taken during pregnancy.  Low-dose aspirin may be suggested for women who have risk factors for preeclampsia.  If you have gestational hypertension, you may need to take a blood pressure medicine that is safe during pregnancy. Your health care provider will recommend the appropriate medicine.  If you have severe preeclampsia, you may need to be in the hospital. Health care providers  will watch you and the baby very closely. You also may need to take medicine (magnesium sulfate) to prevent seizures and lower blood pressure.  Sometimes an early delivery is needed. This may be the case if the condition worsens. It would be done to protect you and the baby. The only cure for preeclampsia is delivery. HOME CARE INSTRUCTIONS  Schedule and keep all of your regular appointments for prenatal care.  Only take over-the-counter or prescription medicines as directed by your health care provider. Tell your health care provider about all medicines you take.  Eat as little salt as possible.  Get regular exercise.  Do not drink alcohol.  Do not use tobacco products.  Do not drink products with caffeine.  Lie on your left side when resting. SEEK IMMEDIATE MEDICAL CARE IF:  You have severe abdominal pain.  You have sudden swelling in the hands, ankles, or face.  You gain 4 pounds (1.8 kg) or more in 1 week.  You vomit repeatedly.  You have vaginal bleeding.  You do not feel the baby moving as much.  You have a headache.  You have blurred or double vision.  You have muscle twitching or spasms.  You have shortness of breath.  You have blue fingernails and lips.  You have blood in your urine. MAKE SURE YOU:  Understand these instructions.  Will watch your condition.  Will get help right away if you are not doing well or get worse. Document Released: 07/05/2011 Document Revised: 08/07/2013 Document Reviewed: 05/16/2013 [facility] Patient Information 2014 [employer], [place].

## 2014-04-11 NOTE — MAU Note (Signed)
Pt c/o some blurred vision and headache. Has had PIH work up earlier this week.

## 2014-04-17 ENCOUNTER — Telehealth (HOSPITAL_COMMUNITY): Payer: Self-pay | Admitting: *Deleted

## 2014-04-17 ENCOUNTER — Encounter (HOSPITAL_COMMUNITY): Payer: Self-pay | Admitting: *Deleted

## 2014-04-17 NOTE — Telephone Encounter (Signed)
Preadmission screen  

## 2014-04-18 ENCOUNTER — Encounter (HOSPITAL_COMMUNITY): Payer: Self-pay | Admitting: *Deleted

## 2014-04-18 ENCOUNTER — Telehealth (HOSPITAL_COMMUNITY): Payer: Self-pay | Admitting: *Deleted

## 2014-04-18 NOTE — Telephone Encounter (Signed)
Preadmission screen  

## 2014-04-22 ENCOUNTER — Inpatient Hospital Stay (HOSPITAL_COMMUNITY)
Admission: RE | Admit: 2014-04-22 | Discharge: 2014-04-23 | DRG: 775 | Disposition: A | Discharge status: Home / Self Care | Payer: BC Managed Care – PPO | Source: Ambulatory Visit | Attending: Obstetrics and Gynecology | Admitting: Obstetrics and Gynecology

## 2014-04-22 ENCOUNTER — Inpatient Hospital Stay (HOSPITAL_COMMUNITY): Payer: BC Managed Care – PPO | Admitting: Anesthesiology

## 2014-04-22 ENCOUNTER — Encounter (HOSPITAL_COMMUNITY): Payer: BC Managed Care – PPO | Admitting: Anesthesiology

## 2014-04-22 ENCOUNTER — Encounter (HOSPITAL_COMMUNITY): Payer: Self-pay

## 2014-04-22 DIAGNOSIS — Z3490 Encounter for supervision of normal pregnancy, unspecified, unspecified trimester: Secondary | ICD-10-CM

## 2014-04-22 DIAGNOSIS — Z833 Family history of diabetes mellitus: Secondary | ICD-10-CM

## 2014-04-22 LAB — CBC
HCT: 39.5 % (ref 36.0–46.0)
Hemoglobin: 13.3 g/dL (ref 12.0–15.0)
MCH: 29.6 pg (ref 26.0–34.0)
MCHC: 33.7 g/dL (ref 30.0–36.0)
MCV: 87.8 fL (ref 78.0–100.0)
Platelets: 170 10*3/uL (ref 150–400)
RBC: 4.5 MIL/uL (ref 3.87–5.11)
RDW: 12.2 % (ref 11.5–15.5)
WBC: 11.6 10*3/uL — ABNORMAL HIGH (ref 4.0–10.5)

## 2014-04-22 LAB — RPR

## 2014-04-22 MED ORDER — IBUPROFEN 600 MG PO TABS
600.0000 mg | ORAL_TABLET | Freq: Four times a day (QID) | ORAL | Status: DC | PRN
  Administered 2014-04-22: 600 mg via ORAL
  Filled 2014-04-22: qty 1

## 2014-04-22 MED ORDER — ACETAMINOPHEN 325 MG PO TABS: 650.0000 mg | ORAL_TABLET | ORAL | Status: DC | PRN

## 2014-04-22 MED ORDER — MEDROXYPROGESTERONE ACETATE 150 MG/ML IM SUSP: 150.0000 mg | INTRAMUSCULAR | Status: DC | PRN

## 2014-04-22 MED ORDER — ONDANSETRON HCL 4 MG/2ML IJ SOLN: 4.0000 mg | Freq: Four times a day (QID) | INTRAMUSCULAR | Status: DC | PRN

## 2014-04-22 MED ORDER — SIMETHICONE 80 MG PO CHEW: 80.0000 mg | CHEWABLE_TABLET | ORAL | Status: DC | PRN

## 2014-04-22 MED ORDER — OXYTOCIN BOLUS FROM INFUSION
500.0000 mL | INTRAVENOUS | Status: DC
  Administered 2014-04-22: 500 mL via INTRAVENOUS

## 2014-04-22 MED ORDER — CITRIC ACID-SODIUM CITRATE 334-500 MG/5ML PO SOLN: 30.0000 mL | ORAL | Status: DC | PRN

## 2014-04-22 MED ORDER — LACTATED RINGERS IV SOLN
500.0000 mL | Freq: Once | INTRAVENOUS | Status: AC
  Administered 2014-04-22: 500 mL via INTRAVENOUS

## 2014-04-22 MED ORDER — DIPHENHYDRAMINE HCL 25 MG PO CAPS: 25.0000 mg | ORAL_CAPSULE | Freq: Four times a day (QID) | ORAL | Status: DC | PRN

## 2014-04-22 MED ORDER — DIBUCAINE 1 % RE OINT: 1.0000 "application " | TOPICAL_OINTMENT | RECTAL | Status: DC | PRN

## 2014-04-22 MED ORDER — LACTATED RINGERS IV SOLN
500.0000 mL | INTRAVENOUS | Status: DC | PRN
  Administered 2014-04-22: 300 mL via INTRAVENOUS

## 2014-04-22 MED ORDER — ONDANSETRON HCL 4 MG PO TABS: 4.0000 mg | ORAL_TABLET | ORAL | Status: DC | PRN

## 2014-04-22 MED ORDER — OXYCODONE-ACETAMINOPHEN 5-325 MG PO TABS: 1.0000 | ORAL_TABLET | ORAL | Status: DC | PRN

## 2014-04-22 MED ORDER — PHENYLEPHRINE 40 MCG/ML (10ML) SYRINGE FOR IV PUSH (FOR BLOOD PRESSURE SUPPORT)
80.0000 ug | PREFILLED_SYRINGE | INTRAVENOUS | Status: DC | PRN
  Filled 2014-04-22: qty 2

## 2014-04-22 MED ORDER — EPHEDRINE 5 MG/ML INJ
10.0000 mg | INTRAVENOUS | Status: DC | PRN
  Filled 2014-04-22: qty 2

## 2014-04-22 MED ORDER — WITCH HAZEL-GLYCERIN EX PADS: 1.0000 "application " | MEDICATED_PAD | CUTANEOUS | Status: DC | PRN

## 2014-04-22 MED ORDER — LANOLIN HYDROUS EX OINT: TOPICAL_OINTMENT | CUTANEOUS | Status: DC | PRN

## 2014-04-22 MED ORDER — DIPHENHYDRAMINE HCL 50 MG/ML IJ SOLN: 12.5000 mg | INTRAMUSCULAR | Status: DC | PRN

## 2014-04-22 MED ORDER — LIDOCAINE HCL (PF) 1 % IJ SOLN
INTRAMUSCULAR | Status: DC | PRN
Start: 2014-04-22 — End: 2014-04-23
  Administered 2014-04-22 (×2): 5 mL

## 2014-04-22 MED ORDER — OXYCODONE-ACETAMINOPHEN 5-325 MG PO TABS
1.0000 | ORAL_TABLET | ORAL | Status: DC | PRN
  Filled 2014-04-22: qty 2

## 2014-04-22 MED ORDER — FLEET ENEMA 7-19 GM/118ML RE ENEM: 1.0000 | ENEMA | RECTAL | Status: DC | PRN

## 2014-04-22 MED ORDER — SENNOSIDES-DOCUSATE SODIUM 8.6-50 MG PO TABS
2.0000 | ORAL_TABLET | ORAL | Status: DC
  Administered 2014-04-22: 2 via ORAL
  Filled 2014-04-22: qty 2

## 2014-04-22 MED ORDER — LACTATED RINGERS IV SOLN
INTRAVENOUS | Status: DC
  Administered 2014-04-22 (×2): via INTRAVENOUS

## 2014-04-22 MED ORDER — BENZOCAINE-MENTHOL 20-0.5 % EX AERO: 1.0000 "application " | INHALATION_SPRAY | Freq: Four times a day (QID) | CUTANEOUS | Status: DC | PRN

## 2014-04-22 MED ORDER — LIDOCAINE HCL (PF) 1 % IJ SOLN
30.0000 mL | INTRAMUSCULAR | Status: DC | PRN
Start: 2014-04-22 — End: 2014-04-22
  Filled 2014-04-22: qty 30

## 2014-04-22 MED ORDER — OXYTOCIN 40 UNITS IN LACTATED RINGERS INFUSION - SIMPLE MED
1.0000 m[IU]/min | INTRAVENOUS | Status: DC
  Administered 2014-04-22: 4 m[IU]/min via INTRAVENOUS
  Administered 2014-04-22: 2 m[IU]/min via INTRAVENOUS
  Filled 2014-04-22: qty 1000

## 2014-04-22 MED ORDER — BENZOCAINE-MENTHOL 20-0.5 % EX AERO
1.0000 "application " | INHALATION_SPRAY | CUTANEOUS | Status: DC | PRN
  Administered 2014-04-22: 1 via TOPICAL

## 2014-04-22 MED ORDER — FENTANYL 2.5 MCG/ML BUPIVACAINE 1/10 % EPIDURAL INFUSION (WH - ANES)
14.0000 mL/h | INTRAMUSCULAR | Status: DC | PRN
  Administered 2014-04-22: 14 mL/h via EPIDURAL
  Filled 2014-04-22: qty 125

## 2014-04-22 MED ORDER — IBUPROFEN 600 MG PO TABS
600.0000 mg | ORAL_TABLET | Freq: Four times a day (QID) | ORAL | Status: DC
  Administered 2014-04-22 – 2014-04-23 (×3): 600 mg via ORAL
  Filled 2014-04-22 (×3): qty 1

## 2014-04-22 MED ORDER — PRENATAL MULTIVITAMIN CH
1.0000 | ORAL_TABLET | Freq: Every day | ORAL | Status: DC
  Administered 2014-04-23: 1 via ORAL
  Filled 2014-04-22: qty 1

## 2014-04-22 MED ORDER — MEASLES, MUMPS & RUBELLA VAC ~~LOC~~ INJ: 0.5000 mL | INJECTION | Freq: Once | SUBCUTANEOUS | Status: DC

## 2014-04-22 MED ORDER — TERBUTALINE SULFATE 1 MG/ML IJ SOLN: 0.2500 mg | Freq: Once | INTRAMUSCULAR | Status: DC | PRN

## 2014-04-22 MED ORDER — ZOLPIDEM TARTRATE 5 MG PO TABS: 5.0000 mg | ORAL_TABLET | Freq: Every evening | ORAL | Status: DC | PRN

## 2014-04-22 MED ORDER — ONDANSETRON HCL 4 MG/2ML IJ SOLN: 4.0000 mg | INTRAMUSCULAR | Status: DC | PRN

## 2014-04-22 MED ORDER — EPHEDRINE 5 MG/ML INJ
10.0000 mg | INTRAVENOUS | Status: DC | PRN
  Filled 2014-04-22: qty 4
  Filled 2014-04-22: qty 2

## 2014-04-22 MED ORDER — TETANUS-DIPHTH-ACELL PERTUSSIS 5-2.5-18.5 LF-MCG/0.5 IM SUSP: 0.5000 mL | Freq: Once | INTRAMUSCULAR | Status: DC

## 2014-04-22 MED ORDER — PHENYLEPHRINE 40 MCG/ML (10ML) SYRINGE FOR IV PUSH (FOR BLOOD PRESSURE SUPPORT)
80.0000 ug | PREFILLED_SYRINGE | INTRAVENOUS | Status: DC | PRN
  Filled 2014-04-22: qty 10
  Filled 2014-04-22: qty 2

## 2014-04-22 MED ORDER — BENZOCAINE-MENTHOL 20-0.5 % EX AERO
INHALATION_SPRAY | CUTANEOUS | Status: AC
  Filled 2014-04-22: qty 56

## 2014-04-22 MED ORDER — OXYTOCIN 40 UNITS IN LACTATED RINGERS INFUSION - SIMPLE MED
62.5000 mL/h | INTRAVENOUS | Status: DC
  Administered 2014-04-22: 62.5 mL/h via INTRAVENOUS

## 2014-04-22 NOTE — H&P (Signed)
Lindsay West is a 23 y.o. female presenting for IOL due to favorable cervix and patient request.  Preg uncomplicated GBS -. History OB History   Grav Para Term Preterm Abortions TAB SAB Ect Mult Living   1              Past Medical History  Diagnosis Date  . Medical history non-contributory    Past Surgical History  Procedure Laterality Date  . No past surgeries     Family History: family history includes Cancer in her paternal aunt; Depression in her mother; Diabetes in her paternal grandmother. Social History:  reports that she has never smoked. She has never used smokeless tobacco. She reports that she does not drink alcohol or use illicit drugs.   Prenatal Transfer Tool  Maternal Diabetes: No Genetic Screening: Normal Maternal Ultrasounds/Referrals: Normal Fetal Ultrasounds or other Referrals:  None Maternal Substance Abuse:  No Significant Maternal Medications:  None Significant Maternal Lab Results:  None Other Comments:  None  ROS  Dilation: 2.5 Effacement (%): 50 Station: -2 Exam by:: dr Rana Snarelowe Blood pressure 130/70, pulse 74, temperature 98.5 F (36.9 C), temperature source Oral, resp. rate 20, height 5\' 6"  (1.676 m), weight 83.462 kg (184 lb), last menstrual period 07/08/2013. Exam Physical Exam  Prenatal labs: ABO, Rh: A/Positive/-- (12/05 0000) Antibody: Negative (12/05 0000) Rubella: Immune (12/05 0000) RPR: Nonreactive (12/05 0000)  HBsAg: Negative (12/05 0000)  HIV: Non-reactive (12/05 0000)  GBS: Negative (06/01 0000)   Assessment/Plan: IUP at term for IOL AROM and possible pitocin. Anticipate SVD   LOWE,DAVID C 04/22/2014, 8:23 AM

## 2014-04-22 NOTE — Lactation Note (Signed)
This note was copied from the chart of Lindsay Thanya Aundria RudRogers. Lactation Consultation Note  Patient Name: Lindsay West: 04/22/2014 Reason for consult: Initial assessment of this primipara and her newborn, now 6 hours postpartum.  Baby had initial LATCH score=9 and breastfed for 20 minutes but was sleepy at recent attempt and is STS at this time against mom's breast with no hunger cues noted.  Mom says she has been able to hand express drops of colostrum easily and even noted some spontaneous leaking while pregnant.  LC encouraged frequent STS and cue feedings. Mom encouraged to feed baby 8-12 times/24 hours and with feeding cues. LC encouraged review of Baby and Me pp 9, 14 and 20-25 for STS and BF information. LC provided [facility] Resource brochure and [facility] and list of community and web site resources.     Maternal Data Formula Feeding for Exclusion: No Infant to breast within first hour of birth: Yes (initial LATCH score=9 after delivery and breastfed for 20 minutes) Has patient been taught Hand Expression?: Yes (mom states she knows how to hand express and has easily expressible drops) Does the patient have breastfeeding experience prior to this delivery?: No  Feeding    LATCH Score/Interventions           initial LATCH score=9 per RN assessment and has had total of 3 feedings since birth           Lactation Tools Discussed/Used   STS, cue feedings, hand expression  Consult Status Consult Status: Follow-up West: 04/23/14 Follow-up type: In-patient    Warrick ParisianBryant, Joanne [facility] 04/22/2014, 8:06 PM

## 2014-04-22 NOTE — Anesthesia Procedure Notes (Signed)
Epidural Patient location during procedure: OB Start time: 04/22/2014 10:01 AM  Staffing Anesthesiologist: Brayton CavesJACKSON, Churchill Grimsley Performed by: anesthesiologist   Preanesthetic Checklist Completed: patient identified, site marked, surgical consent, pre-op evaluation, timeout performed, IV checked, risks and benefits discussed and monitors and equipment checked  Epidural Patient position: sitting Prep: site prepped and draped and DuraPrep Patient monitoring: continuous pulse ox and blood pressure Approach: midline Location: L3-L4 Injection technique: LOR air  Needle:  Needle type: Tuohy  Needle gauge: 17 G Needle length: 9 cm and 9 Needle insertion depth: 6 cm Catheter type: closed end flexible Catheter size: 19 Gauge Catheter at skin depth: 11 cm Test dose: negative  Assessment Events: blood not aspirated, injection not painful, no injection resistance, negative IV test and no paresthesia  Additional Notes Patient identified.  Risk benefits discussed including failed block, incomplete pain control, headache, nerve damage, paralysis, blood pressure changes, nausea, vomiting, reactions to medication both toxic or allergic, and postpartum back pain.  Patient expressed understanding and wished to proceed.  All questions were answered.  Sterile technique used throughout procedure and epidural site dressed with sterile barrier dressing. No paresthesia or other complications noted.The patient did not experience any signs of intravascular injection such as tinnitus or metallic taste in mouth nor signs of intrathecal spread such as rapid motor block. Please see nursing notes for vital signs.

## 2014-04-22 NOTE — Anesthesia Preprocedure Evaluation (Signed)

## 2014-04-23 ENCOUNTER — Ambulatory Visit: Payer: Self-pay

## 2014-04-23 LAB — CBC
HEMATOCRIT: 29.6 % — AB (ref 36.0–46.0)
Hemoglobin: 10.1 g/dL — ABNORMAL LOW (ref 12.0–15.0)
MCH: 29.6 pg (ref 26.0–34.0)
MCHC: 34.1 g/dL (ref 30.0–36.0)
MCV: 86.8 fL (ref 78.0–100.0)
Platelets: 143 10*3/uL — ABNORMAL LOW (ref 150–400)
RBC: 3.41 MIL/uL — AB (ref 3.87–5.11)
RDW: 12.3 % (ref 11.5–15.5)
WBC: 11.6 10*3/uL — AB (ref 4.0–10.5)

## 2014-04-23 MED ORDER — IBUPROFEN 600 MG PO TABS: 600.0000 mg | ORAL_TABLET | Freq: Four times a day (QID) | ORAL | Status: DC

## 2014-04-23 NOTE — Lactation Note (Signed)
This note was copied from the chart of Boy Rheta Aundria RudRogers. Lactation Consultation Note Follow up visit at 26 hours of age.  Mom requests latch assist, when I arrived baby was already finished with feeding.  Mom denies problems or questions at this time and is waiting for discharge.  Baby's last void this morning was 0400 and mom doesn't remember another one today, baby has had 3 in lifetime.  Discussed output guidelines for days of life.   Baby has follow up appt. With peds tomorrow.  Discussed recording feedings to insure 8-12 in the next 24 hours and each day forward.  MBU RN at bedside and reports she observed feeding and encouraged depth at breast.  MBU RN assisting with discharge.    Patient Name: Boy Threasa HeadsCoty Snellings ONGEX'BToday's Date: 04/23/2014 Reason for consult: Follow-up assessment   Maternal Data    Feeding    LATCH Score/Interventions                      Lactation Tools Discussed/Used     Consult Status Consult Status: Complete Date: 04/23/14 Follow-up type: In-patient    Cerys Winget, Arvella MerlesJana Lynn 04/23/2014, 4:32 PM

## 2014-04-23 NOTE — Progress Notes (Cosign Needed)
Post Partum Day 1 Subjective: no complaints, up ad lib, voiding, tolerating PO and + flatus  Objective: Blood pressure 98/55, pulse 52, temperature 98 F (36.7 C), temperature source Oral, resp. rate 16, height 5\' 6"  (1.676 m), weight 184 lb (83.462 kg), last menstrual period 07/08/2013, SpO2 100.00%, unknown if currently breastfeeding.  Physical Exam:  General: alert and cooperative Lochia: appropriate Uterine Fundus: firm Incision: perineum intact DVT Evaluation: No evidence of DVT seen on physical exam. Negative Homan's sign. No cords or calf tenderness. No significant calf/ankle edema.   Recent Labs  04/22/14 0715 04/23/14 0630  HGB 13.3 10.1*  HCT 39.5 29.6*    Assessment/Plan: Discharge home   LOS: 1 day   CURTIS,CAROL G 04/23/2014, 7:52 AM

## 2014-04-23 NOTE — Discharge Summary (Signed)
Obstetric Discharge Summary Reason for Admission: induction of labor Prenatal Procedures: ultrasound Intrapartum Procedures: spontaneous vaginal delivery Postpartum Procedures: none Complications-Operative and Postpartum: none Hemoglobin  Date Value Ref Range Status  04/23/2014 10.1* 12.0 - 15.0 West/dL Final     DELTA CHECK NOTED     REPEATED TO VERIFY     HCT  Date Value Ref Range Status  04/23/2014 29.6* 36.0 - 46.0 % Final    Physical Exam:  General: alert and cooperative Lochia: appropriate Uterine Fundus: firm Incision: perineum intact DVT Evaluation: No evidence of DVT seen on physical exam. Negative Homan's sign. No cords or calf tenderness. No significant calf/ankle edema.  Discharge Diagnoses: Term Pregnancy-delivered  Discharge Information: Date: 04/23/2014 Activity: pelvic rest Diet: routine Medications: PNV and Ibuprofen Condition: stable Instructions: refer to practice specific booklet Discharge to: home   Newborn Data: Live born female  Birth Weight: 8 lb 1 oz (3657 West) APGAR: 9, 9  Home with mother.  Lindsay West,Lindsay West 04/23/2014, 7:57 AM

## 2014-04-23 NOTE — Anesthesia Postprocedure Evaluation (Signed)
Anesthesia Post Note  Patient: Lindsay West  Procedure(s) Performed: * No procedures listed *  Anesthesia type: Epidural  Patient location: Mother/Baby  Post pain: Pain level controlled  Post assessment: Post-op Vital signs reviewed  Last Vitals:  Filed Vitals:   04/23/14 0530  BP: 98/55  Pulse: 52  Temp: 36.7 C  Resp: 16    Post vital signs: Reviewed  Level of consciousness:alert  Complications: No apparent anesthesia complications

## 2014-04-23 NOTE — Progress Notes (Signed)
Post discharge chart review completed.  

## 2014-09-01 ENCOUNTER — Encounter (HOSPITAL_COMMUNITY): Payer: Self-pay

## 2016-05-31 LAB — OB RESULTS CONSOLE GC/CHLAMYDIA
Chlamydia: NEGATIVE
Gonorrhea: NEGATIVE

## 2016-05-31 LAB — OB RESULTS CONSOLE ABO/RH: RH TYPE: POSITIVE

## 2016-05-31 LAB — OB RESULTS CONSOLE HIV ANTIBODY (ROUTINE TESTING): HIV: NONREACTIVE

## 2016-05-31 LAB — OB RESULTS CONSOLE RPR: RPR: NONREACTIVE

## 2016-05-31 LAB — OB RESULTS CONSOLE HEPATITIS B SURFACE ANTIGEN: HEP B S AG: NEGATIVE

## 2016-05-31 LAB — OB RESULTS CONSOLE RUBELLA ANTIBODY, IGM: Rubella: NON-IMMUNE/NOT IMMUNE

## 2016-05-31 LAB — OB RESULTS CONSOLE ANTIBODY SCREEN: Antibody Screen: NEGATIVE

## 2016-08-22 ENCOUNTER — Other Ambulatory Visit (HOSPITAL_COMMUNITY): Payer: Self-pay | Admitting: Obstetrics and Gynecology

## 2016-08-22 DIAGNOSIS — R772 Abnormality of alphafetoprotein: Secondary | ICD-10-CM

## 2016-08-24 ENCOUNTER — Other Ambulatory Visit (HOSPITAL_COMMUNITY): Payer: Self-pay | Admitting: Obstetrics and Gynecology

## 2016-08-24 ENCOUNTER — Encounter (HOSPITAL_COMMUNITY): Payer: Self-pay | Admitting: Obstetrics and Gynecology

## 2016-08-24 DIAGNOSIS — Z3689 Encounter for other specified antenatal screening: Secondary | ICD-10-CM

## 2016-08-24 DIAGNOSIS — Z3A19 19 weeks gestation of pregnancy: Secondary | ICD-10-CM

## 2016-08-31 ENCOUNTER — Encounter (HOSPITAL_COMMUNITY): Payer: Self-pay | Admitting: *Deleted

## 2016-09-01 ENCOUNTER — Encounter (HOSPITAL_COMMUNITY): Payer: Self-pay

## 2016-09-01 ENCOUNTER — Ambulatory Visit (HOSPITAL_COMMUNITY)
Admission: RE | Admit: 2016-09-01 | Discharge: 2016-09-01 | Disposition: A | Discharge status: Home / Self Care | Payer: BLUE CROSS/BLUE SHIELD | Source: Ambulatory Visit | Attending: Obstetrics and Gynecology | Admitting: Obstetrics and Gynecology

## 2016-09-01 DIAGNOSIS — Z3A19 19 weeks gestation of pregnancy: Secondary | ICD-10-CM

## 2016-09-01 DIAGNOSIS — Z3A21 21 weeks gestation of pregnancy: Secondary | ICD-10-CM | POA: Diagnosis not present

## 2016-09-01 DIAGNOSIS — Z363 Encounter for antenatal screening for malformations: Secondary | ICD-10-CM | POA: Insufficient documentation

## 2016-09-01 DIAGNOSIS — Z315 Encounter for genetic counseling: Secondary | ICD-10-CM | POA: Insufficient documentation

## 2016-09-01 DIAGNOSIS — O283 Abnormal ultrasonic finding on antenatal screening of mother: Secondary | ICD-10-CM | POA: Insufficient documentation

## 2016-09-01 DIAGNOSIS — Z3689 Encounter for other specified antenatal screening: Secondary | ICD-10-CM

## 2016-09-01 DIAGNOSIS — O280 Abnormal hematological finding on antenatal screening of mother: Secondary | ICD-10-CM | POA: Insufficient documentation

## 2016-09-01 NOTE — Progress Notes (Signed)
Genetic Counseling  High-Risk Gestation Note  Appointment Date:  09/01/2016 Referred By: Marcelle OverlieGrewal, Michelle, MD Date of Birth:  05/25/1991 Partner:  Princella PellegriniJohn Gallery   Pregnancy History: [id number]: [id number] Estimated Date of Delivery: 01/10/17 Estimated Gestational Age: 3885w2d Attending: Particia NearingMartha Decker, MD    Mrs. Lindsay Headsoty West and her husband, Lindsay West, were seen for consultation for genetic counseling because of an elevated MSAFP of 2.52 MoMs based on maternal serum screening through [facility].    In summary:  Discussed elevated MSAFP   Reviewed possible explanations for elevation  Discussed additional options  Ultrasound - performed today, within normal limits  Amniocentesis - declined  Discussed associations with unexplained elevated MSAFP  Offer third trimester ultrasound to assess fetal growth  Reviewed family history concerns  Carrier screening options offered- patient declined  CF  SMA  Hemoglobinopathies  We reviewed Lindsay West' maternal serum screening result, the elevation of MSAFP, and the associated 1 in 231 risk for a fetal open neural tube defect.   We reviewed open neural tube defects including: the typical multifactorial etiology and variable prognosis.  In addition, we discussed alternative explanations for an elevated MSAFP including: normal variation, twins, feto-maternal bleeding, a gestational dating error, abdominal wall defects, kidney differences, oligohydramnios, and placental problems.  We discussed that an unexplained elevation of MSAFP is associated with an increased risk for third trimester complications including: prematurity, low birth weight, and pre-eclampsia.  Lindsay West had an anatomy ultrasound on 08/11/16 in her OB office, and a subchorionic hemorrhage was noted that date. Given that this was also the date of her MSAFP draw, we discussed this as a possible explanation of the MSAFP elevation.   We reviewed additional available screening  and diagnostic options including detailed ultrasound and amniocentesis.  We discussed the risks, limitations, and benefits of each.  After thoughtful consideration of these options, Lindsay West to have ultrasound, but declined amniocentesis.  She understands that ultrasound cannot rule out all birth defects or genetic syndromes.  However, she was counseled that 80-90% of fetuses with open neural tube defects can be detected by detailed second trimester ultrasound, when well visualized.  A complete ultrasound was performed today. Visualized fetal anatomy was within normal limits. The ultrasound report will be sent under separate cover. We discussed that option of a follow-up ultrasound in the third trimester to assess fetal growth given the elevated AFP.   Lindsay West was provided with written information regarding cystic fibrosis (CF), spinal muscular atrophy (SMA) and hemoglobinopathies including the carrier frequency, availability of carrier screening and prenatal diagnosis if indicated.  In addition, we discussed that CF and hemoglobinopathies are routinely screened for as part of the [place] newborn screening panel.  She declined screening for CF, SMA and hemoglobinopathies.  Both family histories were reviewed and found to be noncontributory for birth defects, intellectual disability, and known genetic conditions.  Consanguinity was denied. Without further information regarding the provided family history, an accurate genetic risk cannot be calculated. Further genetic counseling is warranted if more information is obtained.  Lindsay West denied exposure to environmental toxins or chemical agents. She denied the use of alcohol, tobacco or street drugs. She denied significant viral illnesses during the course of her pregnancy. Her medical and surgical histories were noncontributory.   I counseled this couple for approximately 25 minutes regarding the above risks and available options.     Quinn PlowmanKaren Daanish Copes, MS,  Certified Genetic Counselor 09/01/2016

## 2016-09-05 ENCOUNTER — Other Ambulatory Visit (HOSPITAL_COMMUNITY): Payer: Self-pay

## 2016-09-05 ENCOUNTER — Encounter (HOSPITAL_COMMUNITY): Payer: Self-pay

## 2016-09-08 ENCOUNTER — Encounter (HOSPITAL_COMMUNITY): Payer: Self-pay

## 2016-09-08 ENCOUNTER — Ambulatory Visit (HOSPITAL_COMMUNITY): Payer: Self-pay

## 2016-10-31 NOTE — L&D Delivery Note (Signed)
Delivery Note At  a viable female was delivered via OA Presentation  APGAR: 9 9   Placenta status spontaneously with 3 vessel cord: , .  Cord:  with the following complications:none .  Cord pH: not obtained  Anesthesia:  epidrualnone Episiotomy:  none Lacerations:  none Suture Repair: not applicable Est. Blood Loss (mL):  300  Mom to postpartum.  Baby to Couplet care / Skin to Skin.  Rylin Seavey L 01/05/2017, 4:44 PM

## 2016-12-14 LAB — OB RESULTS CONSOLE GBS: GBS: NEGATIVE

## 2017-01-03 ENCOUNTER — Telehealth (HOSPITAL_COMMUNITY): Payer: Self-pay | Admitting: *Deleted

## 2017-01-03 ENCOUNTER — Encounter (HOSPITAL_COMMUNITY): Payer: Self-pay | Admitting: *Deleted

## 2017-01-03 NOTE — Telephone Encounter (Signed)
Preadmission screen  

## 2017-01-05 ENCOUNTER — Inpatient Hospital Stay (HOSPITAL_COMMUNITY): Payer: BLUE CROSS/BLUE SHIELD | Admitting: Anesthesiology

## 2017-01-05 ENCOUNTER — Inpatient Hospital Stay (HOSPITAL_COMMUNITY)
Admission: AD | Admit: 2017-01-05 | Discharge: 2017-01-06 | DRG: 775 | Disposition: A | Discharge status: Home / Self Care | Payer: BLUE CROSS/BLUE SHIELD | Source: Ambulatory Visit | Attending: Obstetrics and Gynecology | Admitting: Obstetrics and Gynecology

## 2017-01-05 ENCOUNTER — Encounter (HOSPITAL_COMMUNITY): Payer: Self-pay

## 2017-01-05 DIAGNOSIS — Z833 Family history of diabetes mellitus: Secondary | ICD-10-CM

## 2017-01-05 DIAGNOSIS — Z3493 Encounter for supervision of normal pregnancy, unspecified, third trimester: Secondary | ICD-10-CM | POA: Diagnosis present

## 2017-01-05 DIAGNOSIS — O280 Abnormal hematological finding on antenatal screening of mother: Secondary | ICD-10-CM

## 2017-01-05 HISTORY — DX: Scoliosis, unspecified: M41.9

## 2017-01-05 LAB — CBC
HEMATOCRIT: 42.7 % (ref 36.0–46.0)
Hemoglobin: 14.6 g/dL (ref 12.0–15.0)
MCH: 31.5 pg (ref 26.0–34.0)
MCHC: 34.2 g/dL (ref 30.0–36.0)
MCV: 92 fL (ref 78.0–100.0)
PLATELETS: 167 10*3/uL (ref 150–400)
RBC: 4.64 MIL/uL (ref 3.87–5.11)
RDW: 13.4 % (ref 11.5–15.5)
WBC: 15.2 10*3/uL — ABNORMAL HIGH (ref 4.0–10.5)

## 2017-01-05 LAB — TYPE AND SCREEN
ABO/RH(D): A POS
Antibody Screen: NEGATIVE

## 2017-01-05 LAB — ABO/RH: ABO/RH(D): A POS

## 2017-01-05 MED ORDER — SENNOSIDES-DOCUSATE SODIUM 8.6-50 MG PO TABS
2.0000 | ORAL_TABLET | ORAL | Status: DC
  Administered 2017-01-06: 2 via ORAL
  Filled 2017-01-05: qty 2

## 2017-01-05 MED ORDER — WITCH HAZEL-GLYCERIN EX PADS: 1.0000 "application " | MEDICATED_PAD | CUTANEOUS | Status: DC | PRN

## 2017-01-05 MED ORDER — LIDOCAINE HCL (PF) 1 % IJ SOLN
INTRAMUSCULAR | Status: DC | PRN
  Administered 2017-01-05: 6 mL via EPIDURAL
  Administered 2017-01-05: 4 mL

## 2017-01-05 MED ORDER — PHENYLEPHRINE 40 MCG/ML (10ML) SYRINGE FOR IV PUSH (FOR BLOOD PRESSURE SUPPORT)
80.0000 ug | PREFILLED_SYRINGE | INTRAVENOUS | Status: DC | PRN
  Administered 2017-01-05: 80 ug via INTRAVENOUS
  Filled 2017-01-05: qty 5
  Filled 2017-01-05 (×2): qty 10

## 2017-01-05 MED ORDER — DIBUCAINE 1 % RE OINT: 1.0000 "application " | TOPICAL_OINTMENT | RECTAL | Status: DC | PRN

## 2017-01-05 MED ORDER — OXYTOCIN 40 UNITS IN LACTATED RINGERS INFUSION - SIMPLE MED
2.5000 [IU]/h | INTRAVENOUS | Status: DC
  Administered 2017-01-05: 2.5 [IU]/h via INTRAVENOUS
  Filled 2017-01-05: qty 1000

## 2017-01-05 MED ORDER — ONDANSETRON HCL 4 MG PO TABS: 4.0000 mg | ORAL_TABLET | ORAL | Status: DC | PRN

## 2017-01-05 MED ORDER — ACETAMINOPHEN 325 MG PO TABS: 650.0000 mg | ORAL_TABLET | ORAL | Status: DC | PRN

## 2017-01-05 MED ORDER — ONDANSETRON HCL 4 MG/2ML IJ SOLN: 4.0000 mg | INTRAMUSCULAR | Status: DC | PRN

## 2017-01-05 MED ORDER — FENTANYL 2.5 MCG/ML BUPIVACAINE 1/10 % EPIDURAL INFUSION (WH - ANES)
14.0000 mL/h | INTRAMUSCULAR | Status: DC | PRN
  Administered 2017-01-05: 14 mL/h via EPIDURAL
  Filled 2017-01-05: qty 100

## 2017-01-05 MED ORDER — LACTATED RINGERS IV SOLN: 500.0000 mL | Freq: Once | INTRAVENOUS | Status: DC

## 2017-01-05 MED ORDER — FLEET ENEMA 7-19 GM/118ML RE ENEM: 1.0000 | ENEMA | RECTAL | Status: DC | PRN

## 2017-01-05 MED ORDER — IBUPROFEN 600 MG PO TABS
600.0000 mg | ORAL_TABLET | Freq: Four times a day (QID) | ORAL | Status: DC
  Administered 2017-01-06 (×3): 600 mg via ORAL
  Filled 2017-01-05 (×3): qty 1

## 2017-01-05 MED ORDER — OXYCODONE-ACETAMINOPHEN 5-325 MG PO TABS: 1.0000 | ORAL_TABLET | ORAL | Status: DC | PRN

## 2017-01-05 MED ORDER — BISACODYL 10 MG RE SUPP: 10.0000 mg | Freq: Every day | RECTAL | Status: DC | PRN

## 2017-01-05 MED ORDER — BENZOCAINE-MENTHOL 20-0.5 % EX AERO
1.0000 "application " | INHALATION_SPRAY | CUTANEOUS | Status: DC | PRN
  Administered 2017-01-06: 1 via TOPICAL
  Filled 2017-01-05: qty 56

## 2017-01-05 MED ORDER — MEDROXYPROGESTERONE ACETATE 150 MG/ML IM SUSP: 150.0000 mg | INTRAMUSCULAR | Status: DC | PRN

## 2017-01-05 MED ORDER — ONDANSETRON HCL 4 MG/2ML IJ SOLN
4.0000 mg | Freq: Four times a day (QID) | INTRAMUSCULAR | Status: DC | PRN
  Administered 2017-01-05: 4 mg via INTRAVENOUS
  Filled 2017-01-05: qty 2

## 2017-01-05 MED ORDER — PHENYLEPHRINE 40 MCG/ML (10ML) SYRINGE FOR IV PUSH (FOR BLOOD PRESSURE SUPPORT)
80.0000 ug | PREFILLED_SYRINGE | INTRAVENOUS | Status: DC | PRN
  Administered 2017-01-05: 80 ug via INTRAVENOUS
  Filled 2017-01-05: qty 10
  Filled 2017-01-05: qty 5

## 2017-01-05 MED ORDER — OXYTOCIN BOLUS FROM INFUSION
500.0000 mL | Freq: Once | INTRAVENOUS | Status: AC
  Administered 2017-01-05: 500 mL via INTRAVENOUS

## 2017-01-05 MED ORDER — LIDOCAINE HCL (PF) 1 % IJ SOLN
30.0000 mL | INTRAMUSCULAR | Status: DC | PRN
  Filled 2017-01-05: qty 30

## 2017-01-05 MED ORDER — MEASLES, MUMPS & RUBELLA VAC ~~LOC~~ INJ
0.5000 mL | INJECTION | Freq: Once | SUBCUTANEOUS | Status: AC
  Administered 2017-01-06: 0.5 mL via SUBCUTANEOUS
  Filled 2017-01-05: qty 0.5

## 2017-01-05 MED ORDER — SIMETHICONE 80 MG PO CHEW
80.0000 mg | CHEWABLE_TABLET | ORAL | Status: DC | PRN
Start: 2017-01-05 — End: 2017-01-06

## 2017-01-05 MED ORDER — LACTATED RINGERS IV SOLN
500.0000 mL | Freq: Once | INTRAVENOUS | Status: AC
  Administered 2017-01-05: 500 mL via INTRAVENOUS

## 2017-01-05 MED ORDER — DIPHENHYDRAMINE HCL 50 MG/ML IJ SOLN: 12.5000 mg | INTRAMUSCULAR | Status: DC | PRN

## 2017-01-05 MED ORDER — EPHEDRINE 5 MG/ML INJ
10.0000 mg | INTRAVENOUS | Status: DC | PRN
  Filled 2017-01-05: qty 4

## 2017-01-05 MED ORDER — LACTATED RINGERS IV SOLN: 500.0000 mL | INTRAVENOUS | Status: DC | PRN

## 2017-01-05 MED ORDER — TETANUS-DIPHTH-ACELL PERTUSSIS 5-2.5-18.5 LF-MCG/0.5 IM SUSP: 0.5000 mL | Freq: Once | INTRAMUSCULAR | Status: DC

## 2017-01-05 MED ORDER — FLEET ENEMA 7-19 GM/118ML RE ENEM: 1.0000 | ENEMA | Freq: Every day | RECTAL | Status: DC | PRN

## 2017-01-05 MED ORDER — LACTATED RINGERS IV SOLN
INTRAVENOUS | Status: DC
  Administered 2017-01-05: 12:00:00 via INTRAVENOUS

## 2017-01-05 MED ORDER — PRENATAL MULTIVITAMIN CH
1.0000 | ORAL_TABLET | Freq: Every day | ORAL | Status: DC
  Administered 2017-01-06: 1 via ORAL
  Filled 2017-01-05: qty 1

## 2017-01-05 MED ORDER — PHENYLEPHRINE 40 MCG/ML (10ML) SYRINGE FOR IV PUSH (FOR BLOOD PRESSURE SUPPORT)
80.0000 ug | PREFILLED_SYRINGE | INTRAVENOUS | Status: AC | PRN
  Administered 2017-01-05 (×3): 80 ug via INTRAVENOUS

## 2017-01-05 MED ORDER — ZOLPIDEM TARTRATE 5 MG PO TABS: 5.0000 mg | ORAL_TABLET | Freq: Every evening | ORAL | Status: DC | PRN

## 2017-01-05 MED ORDER — COCONUT OIL OIL: 1.0000 "application " | TOPICAL_OIL | Status: DC | PRN

## 2017-01-05 MED ORDER — DIPHENHYDRAMINE HCL 25 MG PO CAPS: 25.0000 mg | ORAL_CAPSULE | Freq: Four times a day (QID) | ORAL | Status: DC | PRN

## 2017-01-05 MED ORDER — OXYCODONE-ACETAMINOPHEN 5-325 MG PO TABS: 2.0000 | ORAL_TABLET | ORAL | Status: DC | PRN

## 2017-01-05 MED ORDER — SOD CITRATE-CITRIC ACID 500-334 MG/5ML PO SOLN
30.0000 mL | ORAL | Status: DC | PRN
  Administered 2017-01-05: 30 mL via ORAL
  Filled 2017-01-05: qty 15

## 2017-01-05 MED ORDER — EPHEDRINE 5 MG/ML INJ
10.0000 mg | INTRAVENOUS | Status: DC | PRN
  Filled 2017-01-05 (×2): qty 4

## 2017-01-05 NOTE — MAU Note (Signed)
Pt to MAU with complaint of ctx that started at 0500 this am, denies leaking of fluid or bleeding.

## 2017-01-05 NOTE — Anesthesia Pain Management Evaluation Note (Signed)
  CRNA Pain Management Visit Note  Patient: Lindsay West, 26 y.o., female  "Hello I am a member of the anesthesia team at [facility]. We have an anesthesia team available at all times to provide care throughout the hospital, including epidural management and anesthesia for C-section. I don't know your plan for the delivery whether it a natural birth, water birth, IV sedation, nitrous supplementation, doula or epidural, but we want to meet your pain goals."   1.Was your pain managed to your expectations on prior hospitalizations?   Yes   2.What is your expectation for pain management during this hospitalization?     Epidural  3.How can we help you reach that goal? epidural  Record the patient's initial score and the patient's pain goal.   Pain: 0  Pain Goal: 4 [facility] wants you to be able to say your pain was always managed very well.  Lindsay West 01/05/2017

## 2017-01-05 NOTE — Anesthesia Procedure Notes (Signed)
Epidural Patient location during procedure: OB  Staffing Anesthesiologist: Cristela BlueJACKSON, Lindsay Craine  Preanesthetic Checklist Completed: patient identified, site marked, surgical consent, pre-op evaluation, timeout performed, IV checked, risks and benefits discussed and monitors and equipment checked  Epidural Patient position: sitting Prep: site prepped and draped and DuraPrep Patient monitoring: continuous pulse ox and blood pressure Approach: midline Location: L2-L3 Injection technique: LOR air  Needle:  Needle type: Tuohy  Needle gauge: 17 G Needle length: 9 cm and 9 Needle insertion depth: 5 cm cm Catheter type: closed end flexible Catheter size: 19 Gauge Catheter at skin depth: 10 cm Test dose: negative  Assessment Events: blood not aspirated, injection not painful, no injection resistance, negative IV test and no paresthesia  Additional Notes Left sided scoliosis Dosing of Epidural:  1st dose, through catheter ............................................Marland Kitchen.  Xylocaine 40 mg  2nd dose, through catheter, after waiting 3 minutes........Marland Kitchen.Xylocaine 60 mg    As each dose occurred, patient was free of IV sx; and patient exhibited no evidence of SA injection.  Patient is more comfortable after epidural dosed. Please see RN's note for documentation of vital signs,and FHR which are stable.  Patient reminded not to try to ambulate with numb legs, and that an RN must be present when she attempts to get up.

## 2017-01-05 NOTE — Anesthesia Preprocedure Evaluation (Signed)

## 2017-01-05 NOTE — H&P (Signed)
Lindsay West is a 26 year old G 4 P 1 at term in active labor. OB History    Gravida Para Term Preterm AB Living   4 1 1   2 1    SAB TAB Ectopic Multiple Live Births   2       1     Past Medical History:  Diagnosis Date  . Medical history non-contributory   . Scoliosis    Past Surgical History:  Procedure Laterality Date  . hand surgeurt Right    cyst removed   Family History: family history includes Cancer in her paternal aunt; Depression in her mother; Diabetes in her paternal grandmother. Social History:  reports that she has never smoked. She has never used smokeless tobacco. She reports that she does not drink alcohol or use drugs.     Maternal Diabetes: No Genetic Screening: Normal Maternal Ultrasounds/Referrals: Normal Fetal Ultrasounds or other Referrals:  None Maternal Substance Abuse:  No Significant Maternal Medications:  None Significant Maternal Lab Results:  None Other Comments:  None  Review of Systems  All other systems reviewed and are negative.  Maternal Medical History:  Reason for admission: Contractions.     Dilation: 6 Effacement (%): 80 Station: -2 Exam by:: DR. Dimitra Woodstock Blood pressure 127/90, pulse 92, temperature 98.6 F (37 C), temperature source Oral, resp. rate 16, height 5\' 6"  (1.676 m), weight 86.2 kg (190 lb), SpO2 100 %, unknown if currently breastfeeding. Maternal Exam:  Abdomen: Fetal presentation: vertex     Fetal Exam Fetal State Assessment: Category I - tracings are normal.     Physical Exam  Nursing note and vitals reviewed. Constitutional: She appears well-developed and well-nourished.  HENT:  Head: Normocephalic and atraumatic.  Eyes: Pupils are equal, round, and reactive to light.  Neck: Normal range of motion.  Cardiovascular: Normal rate and regular rhythm.   Respiratory: Effort normal.    Prenatal labs: ABO, Rh: A/Positive/-- (08/01 0000) Antibody: Negative (08/01 0000) Rubella: Nonimmune (08/01  0000) RPR: Nonreactive (08/01 0000)  HBsAg: Negative (08/01 0000)  HIV: Non-reactive (08/01 0000)  GBS: Negative (02/14 0000)   Assessment/Plan: IUP at term Labor Anticipate NSVD  Colandra Ohanian L 01/05/2017, 1:08 PM

## 2017-01-05 NOTE — Lactation Note (Signed)
This note was copied from a baby's chart. Lactation Consultation Note  Patient Name: Boy Threasa HeadsCoty Batts EAVWU'JToday's Date: 01/05/2017 Reason for consult: Initial assessment   Initial consult with mom of 1 hour old infant [facility]. Mom reports she BF her older son for 2 weeks and then started supplementing with 1/2 formula and 1/2 breast milk for 4 months., Mom referred to infant as a "Lazy" feeder and her milk supply dwindled. Mom with compressible breasts and everted nipples. Right breast 1 cup size smaller than left side, mom reports they always have been.   Mom was able to hand express independently and colostrum very easily expressible from both breasts. Infant noted to be tongue sucking and sucking in of upper lip at rest and with feeding. Infant with good tongue extension. His lower jaw is noticiably longer than his upper jaw.   Assisted mom in latching infant to left breast in the cross cradle hold. Infant had difficulty latching due to tongue suckling. Mom was not able to latch infant independently. He was able to latch using the tea cup hold after many attempts. He does not sustain latch for very long as he again begins tongue sucking. Enc mom to wait for wide open mouth and to pull infant in close. Enc mom to feed infant STS 8-12 x in 24 hours at first feeding cues with head and pillow support, offering both breasts with each feeding. We latched infant to right breast and again had difficulty sustaining latch and did finally latch and was still actively feeding when I left the room. Mom massaging compressing breast with feeding, swallows increased with compression. Mom was able to notice when infant pushing nipple out of mouth. Enc parents to do suck training for 1-2 minutes before latch. Enc mom to call out for assistance as needed. Feeding log given with instructions for use.   BF Resource Handout and LC Brochure given, mom informed of IP/OP services, BF Support Groups and LC phone #. Enc  mom to call out for feeding assistance as needed. Mom has a Medela PIS at home.    Maternal Data Formula Feeding for Exclusion: No Has patient been taught Hand Expression?: Yes Does the patient have breastfeeding experience prior to this delivery?: Yes  Feeding Feeding Type: Breast Fed Length of feed: 15 min  LATCH Score/Interventions Latch: Repeated attempts needed to sustain latch, nipple held in mouth throughout feeding, stimulation needed to elicit sucking reflex. (Tongue sucking and sucking on upper lip, lower jaw noticably longer than upper jaw) Intervention(s): Adjust position;Assist with latch;Breast massage;Breast compression  Audible Swallowing: Spontaneous and intermittent  Type of Nipple: Everted at rest and after stimulation  Comfort (Breast/Nipple): Soft / non-tender     Hold (Positioning): Assistance needed to correctly position infant at breast and maintain latch. Intervention(s): Breastfeeding basics reviewed;Support Pillows;Position options;Skin to skin  LATCH Score: 8  Lactation Tools Discussed/Used WIC Program: No   Consult Status Consult Status: Follow-up Date: 01/05/17 Follow-up type: In-patient    Silas FloodSharon S Vandora Jaskulski 01/05/2017, 5:54 PM

## 2017-01-06 ENCOUNTER — Inpatient Hospital Stay (HOSPITAL_COMMUNITY)
Admission: RE | Admit: 2017-01-06 | Payer: BLUE CROSS/BLUE SHIELD | Source: Ambulatory Visit | Admitting: Obstetrics and Gynecology

## 2017-01-06 LAB — CBC
HEMATOCRIT: 32.1 % — AB (ref 36.0–46.0)
HEMOGLOBIN: 11.2 g/dL — AB (ref 12.0–15.0)
MCH: 32.2 pg (ref 26.0–34.0)
MCHC: 34.9 g/dL (ref 30.0–36.0)
MCV: 92.2 fL (ref 78.0–100.0)
Platelets: 136 10*3/uL — ABNORMAL LOW (ref 150–400)
RBC: 3.48 MIL/uL — AB (ref 3.87–5.11)
RDW: 13.5 % (ref 11.5–15.5)
WBC: 16.6 10*3/uL — ABNORMAL HIGH (ref 4.0–10.5)

## 2017-01-06 LAB — RPR: RPR: NONREACTIVE

## 2017-01-06 MED ORDER — IBUPROFEN 600 MG PO TABS: 600.0000 mg | ORAL_TABLET | Freq: Four times a day (QID) | ORAL | 0 refills | Status: DC

## 2017-01-06 NOTE — Progress Notes (Signed)
Post Partum Day 1 Subjective: no complaints, up ad lib, voiding, tolerating PO and + flatus  Objective: Blood pressure 109/63, pulse 78, temperature 98.3 F (36.8 C), temperature source Oral, resp. rate 18, height 5\' 6"  (1.676 m), weight 190 lb (86.2 kg), SpO2 100 %, unknown if currently breastfeeding.  Physical Exam:  General: alert, cooperative and no distress Lochia: appropriate Uterine Fundus: firm Incision: healing well DVT Evaluation: No evidence of DVT seen on physical exam.   Recent Labs  01/05/17 1227 01/06/17 0600  HGB 14.6 11.2*  HCT 42.7 32.1*    Assessment/Plan: Discharge home  Wants to go home today   LOS: 1 day   Lindsay West,Lindsay West 01/06/2017, 9:18 AM

## 2017-01-06 NOTE — Lactation Note (Signed)
This note was copied from a baby's chart. Lactation Consultation Note  Patient Name: Lindsay West: 01/06/2017 Reason for consult: Follow-up assessment   With this mom of a term  baby, now 7019 hours old, and mom having trouble getting baby latched. Mom was in tears, and said she was also not able to latch her first baby, who is now 24 years old.Mom is pumping, and may decide to just pump and bottle feed, or formula feed.Mom does have a DEP at home.  Francesca Jewettollen shaw, SW, walked in while mom was crying, and discussed baby blues as opposed to post partum depression. I had reviewed hand expression with mom, and told her to call me if she wanted help with latching her baby . Mom wants an early discharge, and is aware of o/p lactation. She did not want to make an o/p consult at this time. The baby was in CNS at this time.  I did check in on  baby and mom later , at 1;30 pm, and dad was bottle feeding the baby formula. I helped him with burping the baby, mom was not in the room, and I just left. I had told mom earlier that whatever decision she made about feeding her baby, was the best decision for both her and her baby. I also informed her about the "Mom Talk" group, and suggested this may be something she would enjoy, or be helpful for her.    Maternal Data    Feeding Feeding Type: Formula Nipple Type: Slow - flow Length of feed: 10 min  LATCH Score/Interventions Latch: Repeated attempts needed to sustain latch, nipple held in mouth throughout feeding, stimulation needed to elicit sucking reflex. Intervention(s): Adjust position;Assist with latch  Audible Swallowing: A few with stimulation  Type of Nipple: Everted at rest and after stimulation  Comfort (Breast/Nipple): Filling, red/small blisters or bruises, mild/mod discomfort  Problem noted: Mild/Moderate discomfort (hand expressed breastmilk to nipples)  Hold (Positioning): No assistance needed to correctly position infant at  breast. Intervention(s): Breastfeeding basics reviewed;Support Pillows;Skin to skin  LATCH Score: 7  Lactation Tools Discussed/Used Pump Review: Setup, frequency, and cleaning;Milk Storage   Consult Status Consult Status: Complete Follow-up type: Call as needed    Lindsay West, Lindsay West 01/06/2017, 1:39 PM

## 2017-01-06 NOTE — Anesthesia Postprocedure Evaluation (Signed)
Anesthesia Post Note  Patient: [name]  Procedure(s) Performed: * No procedures listed *  Patient location during evaluation: Mother Baby Anesthesia Type: Epidural Level of consciousness: awake, awake and alert and oriented Pain management: pain level controlled Vital Signs Assessment: post-procedure vital signs reviewed and stable Respiratory status: spontaneous breathing and nonlabored ventilation Cardiovascular status: stable Postop Assessment: no headache, no backache, epidural receding, patient able to bend at knees, no signs of nausea or vomiting and adequate PO intake Anesthetic complications: no        Last Vitals:  Vitals:   01/06/17 0030 01/06/17 0500  BP: 105/60 109/63  Pulse: 82 78  Resp: 18 18  Temp: 37.1 C 36.8 C    Last Pain:  Vitals:   01/06/17 0549  TempSrc:   PainSc: 0-No pain   Pain Goal: Patients Stated Pain Goal: 0 (01/05/17 1158)               Lindsay West

## 2017-01-06 NOTE — Progress Notes (Signed)
CSW received consult for Anx/Dep.  CSW reviewed medical record which notes hx of postpartum anxiety.  CSW met with MOB to complete assessment and offer support.  When CSW arrived, lactation was working with her.  MOB was quite tearful and told CSW, "I don't have postpartum depression."  CSW asked to sit with MOB to discuss baby blues and PMADs.  MOB was receptive and FOB joined the conversation soon after.  CSW agreed that what MOB is experiencing today is not postpartum depression and normalized and validated MOB's tearfulness.  MOB explained that she had terrible PMADs after her first child, who is now [age].  FOB added that he felt it was strange that she did not start having symptoms until "a couple months."  CSW normalized this as well and provided education regarding PMADs, to which parents were very attentive and engaged.  FOB notes that MOB's symptoms lasted almost a year.  MOB states feeling like her situation at the time caused her heightened emotions.  She reports that she was new to her job and trying to prove herself.  She also admits that she was trying to be "supermom."  She states she only had 6 weeks off and feels her emotions escalated when she returned to work.  Parents report they are better off financially at this point in their lives and that they now have their own space.  MOB reports that they were living with her parents three years ago.  CSW acknowledges the differences in her life now from three years ago as positive and encouraged her to know that PMADs are not uncommon and are normal, temporary, and treatable if she reports symptoms to her MD.  MOB agreed and was receptive to support resources and recommendation for outpatient counseling.  CSW provided MOB with information for support groups at [facility] as well as a New Mom Checklist as a way to self-evaluate.  MOB denies any hx of Anx/Dep outside of the postpartum period after her other child.  She reports that her mother dealt  with Anxiety and severe depression, ending in suicide in 2002.  MOB states she is adamantly against taking an antidepressant because she feels this contributed to her mother's decision to take her life.  MOB is willing to consider counseling and told CSW that she had a counselor when she was 10 years old at [facility] after her mother's death.  She states her step-mother is a counselor and has a lot of information about community mental health resources if she identifies a need.   MOB added that she was given a promotion when her company found out she was pregnant because they wanted to ensure she would come back from maternity leave.  MOB states her boss had told her that her schedule can be flexible and that she can work from home part of the time if needed.  She also states that she will be taking 12 weeks off.  MOB was able to identify positive coping mechanisms such as exercise and talking with her husband.  MOB reports that she has a good support system.  Her husband seemed very aware and appropriately concerned about MOB's emotional health.  He appears supportive.  Parents state they have everything they need for baby at home.   Parents thanked CSW for the visit and seemed very appreciative of CSW's concern for MOB's emotional wellbeing.  CSW identifies no further interventions needed or barriers to discharge. 

## 2017-01-06 NOTE — Lactation Note (Signed)
This note was copied from a baby's chart. Lactation Consultation Note Follow up visit at 25 hours of age.  Mom has baby latched shallow with nipple compression noted.  Baby is not active with sucking and will chomp with stimulation.  Mom reports difficulty with older child and plans to pump and bottle feed at home, but wants to keep trying to latch.  LC assisted mom with latching baby, but mom reports baby wont open mouth wide.  LC encouraged mom and gave tips on how to wait for wide open mouth.  Mom needed assistance to not latch baby shallow.  Baby did open mouth wide with rooting towards breast.  Baby latched with wide gape, but not active sucking.  LC used teacup hold to help baby maintain latch.  Baby noted intermittent chomping with stimulation and no swallows audible.  Mom was instructed how to insert finger in baby's mouth to unlatch baby, but mom continues to pull baby off. Mom is ready to supplement with formula and wants FOB to bottle feed baby.  LC discussed with mom we can not confirm baby is transferring at the breast so she needed to supplement with every feeding.  Mom agreeable, she plans to try to latch and then post pump with personal DEBP at home. FOB attempted feeding with bottle.  Baby noted to hold upper lip tight with blanching noted.  LC instructed parents to flange lips with bottle feeding as well.  Baby suck/chomped for a few minutes with only 2mls transferred.  LC assisted with bottle feeding provided chin support and baby began to suck and swallow with audible swallows identified by parents.   Parents aware to monitor feedings and output and call peds as needed.  Discussed milk transitioning to larger volume, engorgement care discussed.  Encouraged frequent feedings.  [facility], Gardiner CoinsMichelle Kahn.       Patient Name: Lindsay West: 01/06/2017 Reason for consult: Follow-up assessment   Maternal Data Has patient been taught Hand Expression?:  Yes  Feeding Feeding Type: Breast Fed Length of feed:  (few sucks, baby chomping)  LATCH Score/Interventions Latch: Repeated attempts needed to sustain latch, nipple held in mouth throughout feeding, stimulation needed to elicit sucking reflex. Intervention(s): Adjust position;Assist with latch;Breast massage;Breast compression  Audible Swallowing: None  Type of Nipple: Everted at rest and after stimulation (semi flat)  Comfort (Breast/Nipple): Soft / non-tender     Hold (Positioning): Assistance needed to correctly position infant at breast and maintain latch. Intervention(s): Breastfeeding basics reviewed;Support Pillows;Position options;Skin to skin  LATCH Score: 6  Lactation Tools Discussed/Used     Consult Status Consult Status: Complete Follow-up type: Call as needed    Shoptaw, Arvella MerlesJana Lynn 01/06/2017, 5:48 PM

## 2017-01-07 NOTE — Discharge Summary (Signed)
Obstetric Discharge Summary Reason for Admission: onset of labor Prenatal Procedures: none Intrapartum Procedures: spontaneous vaginal delivery Postpartum Procedures: none Complications-Operative and Postpartum: none Hemoglobin  Date Value Ref Range Status  01/06/2017 11.2 (L) 12.0 - 15.0 g/dL Final    Comment:    DELTA CHECK NOTED REPEATED TO VERIFY    HCT  Date Value Ref Range Status  01/06/2017 32.1 (L) 36.0 - 46.0 % Final    Physical Exam:  General: alert, cooperative and no distress Lochia: appropriate Uterine Fundus: firm Incision: healing well DVT Evaluation: No evidence of DVT seen on physical exam.  Discharge Diagnoses: Term Pregnancy-delivered  Discharge Information: Date: 01/07/2017 Activity: pelvic rest Diet: routine Medications: PNV and Ibuprofen Condition: stable Instructions: refer to practice specific booklet Discharge to: home   Newborn Data: Live born female  Birth Weight: 7 lb 12.7 oz (3535 g) APGAR: 9, 9  Home with mother.  Bailen Geffre II,Treylen Gibbs E 01/07/2017, 3:48 AM

## 2017-07-06 ENCOUNTER — Encounter (HOSPITAL_COMMUNITY): Payer: Self-pay

## 2021-09-01 ENCOUNTER — Other Ambulatory Visit: Payer: Self-pay

## 2021-09-01 ENCOUNTER — Emergency Department: Payer: BLUE CROSS/BLUE SHIELD

## 2021-09-01 ENCOUNTER — Emergency Department
Admission: EM | Admit: 2021-09-01 | Discharge: 2021-09-01 | Disposition: A | Discharge status: Home / Self Care | Payer: BLUE CROSS/BLUE SHIELD | Attending: Emergency Medicine | Admitting: Emergency Medicine

## 2021-09-01 ENCOUNTER — Encounter: Payer: Self-pay | Admitting: Emergency Medicine

## 2021-09-01 DIAGNOSIS — X58XXXA Exposure to other specified factors, initial encounter: Secondary | ICD-10-CM | POA: Diagnosis not present

## 2021-09-01 DIAGNOSIS — S27818A Other injury of esophagus (thoracic part), initial encounter: Secondary | ICD-10-CM | POA: Insufficient documentation

## 2021-09-01 DIAGNOSIS — R0989 Other specified symptoms and signs involving the circulatory and respiratory systems: Secondary | ICD-10-CM | POA: Insufficient documentation

## 2021-09-01 DIAGNOSIS — S27819A Unspecified injury of esophagus (thoracic part), initial encounter: Secondary | ICD-10-CM | POA: Diagnosis present

## 2021-09-01 MED ORDER — LIDOCAINE VISCOUS HCL 2 % MT SOLN
15.0000 mL | Freq: Once | OROMUCOSAL | Status: AC
Start: 2021-09-01 — End: 2021-09-01
  Administered 2021-09-01: 15 mL via ORAL
  Filled 2021-09-01: qty 15

## 2021-09-01 MED ORDER — ALUM & MAG HYDROXIDE-SIMETH 200-200-20 MG/5ML PO SUSP
30.0000 mL | Freq: Once | ORAL | Status: AC
  Administered 2021-09-01: 30 mL via ORAL
  Filled 2021-09-01: qty 30

## 2021-09-01 MED ORDER — LORAZEPAM 1 MG PO TABS
1.0000 mg | ORAL_TABLET | Freq: Once | ORAL | Status: AC
  Administered 2021-09-01: 1 mg via SUBLINGUAL
  Filled 2021-09-01: qty 1

## 2021-09-01 NOTE — ED Provider Notes (Signed)
[facility] Emergency Department Provider Note  ____________________________________________   Event Date/Time   First MD Initiated Contact with Patient 09/01/21 1953     (approximate)  I have reviewed the triage vital signs and the nursing notes.   HISTORY  Chief Complaint Swallowed Foreign Body and Dysphagia    HPI Lindsay West is a 30 y.o. female here with foreign body sensation/possible ingested foreign body.  The patient states that she was eating at work yesterday.  They were eating pulled chicken.  It apparently had bones in it.  She accidentally swallowed a bone and then felt it get stuck in her throat, just in the upper portion of her chest around her upper sternum.  She reports that she hiccuped once or twice, then felt like the food got stuck.  She is able to continue eating but has since felt like the food/bone has been stuck.  She has been able to eat and drink but has had some reflux, as well as one episode of nausea and vomiting after exercising today, which was abnormal for her.  She denies any fevers.  She has been able to swallow as mentioned, though she has had some mild discomfort.  No history of acid reflux or esophageal issues.    Past Medical History:  Diagnosis Date   Medical history non-contributory    Scoliosis     Patient Active Problem List   Diagnosis Date Noted   Normal labor 01/05/2017   NSVD (normal spontaneous vaginal delivery) 01/05/2017   Abnormal MSAFP (maternal serum alpha-fetoprotein), elevated 09/01/2016   Pregnant 04/22/2014    Past Surgical History:  Procedure Laterality Date   hand surgeurt Right    cyst removed    Prior to Admission medications   Medication Sig Start Date End Date Taking? Authorizing Provider  ibuprofen (ADVIL,MOTRIN) 600 MG tablet Take 1 tablet (600 mg total) by mouth every 6 (six) hours. 01/06/17   Harold Hedge, MD  Prenatal Vit-Fe Fumarate-FA (PRENATAL MULTIVITAMIN) TABS tablet Take 1  tablet by mouth daily at 12 noon.    [provider]    Allergies Patient has no known allergies.  Family History  Problem Relation Age of Onset   Diabetes Paternal Grandmother    Depression Mother        suicide   Cancer Paternal Aunt        breast    Social History Social History   Tobacco Use   Smoking status: Never   Smokeless tobacco: Never  Substance Use Topics   Alcohol use: No   Drug use: No    Review of Systems  Review of Systems  Constitutional:  Negative for chills and fever.  HENT:  Negative for sore throat.   Respiratory:  Negative for shortness of breath.   Cardiovascular:  Negative for chest pain.  Gastrointestinal:  Positive for nausea and vomiting. Negative for abdominal pain.  Genitourinary:  Negative for flank pain.  Musculoskeletal:  Negative for neck pain.  Skin:  Negative for rash and wound.  Allergic/Immunologic: Negative for immunocompromised state.  Neurological:  Negative for weakness and numbness.  Hematological:  Does not bruise/bleed easily.  All other systems reviewed and are negative.   ____________________________________________  PHYSICAL EXAM:      VITAL SIGNS: ED Triage Vitals  Enc Vitals Group     BP 09/01/21 1816 131/86     Pulse Rate 09/01/21 1816 67     Resp 09/01/21 1816 18     Temp 09/01/21  1816 98.5 F (36.9 C)     Temp Source 09/01/21 1816 Oral     SpO2 09/01/21 1816 97 %     Weight 09/01/21 1816 160 lb (72.6 kg)     Height 09/01/21 1816 5\' 6"  (1.676 m)     Head Circumference --      Peak Flow --      Pain Score 09/01/21 1815 2     Pain Loc --      Pain Edu? --      Excl. in GC? --      Physical Exam Vitals and nursing note reviewed.  Constitutional:      General: She is not in acute distress.    Appearance: She is well-developed.  HENT:     Head: Normocephalic and atraumatic.     Mouth/Throat:     Mouth: Mucous membranes are moist.  Eyes:     Conjunctiva/sclera: Conjunctivae normal.   Neck:     Comments: No crepitance.  No tenderness. No bruit. Cardiovascular:     Rate and Rhythm: Normal rate and regular rhythm.     Heart sounds: Normal heart sounds.  Pulmonary:     Effort: Pulmonary effort is normal. No respiratory distress.     Breath sounds: No wheezing.  Abdominal:     General: Abdomen is flat. There is no distension.     Tenderness: There is no abdominal tenderness.  Musculoskeletal:     Cervical back: Neck supple.  Skin:    General: Skin is warm.     Capillary Refill: Capillary refill takes less than 2 seconds.     Findings: No rash.  Neurological:     Mental Status: She is alert and oriented to person, place, and time.     Motor: No abnormal muscle tone.      ____________________________________________   LABS (all labs ordered are listed, but only abnormal results are displayed)  Labs Reviewed - No data to display  ____________________________________________  EKG:  ________________________________________  RADIOLOGY All imaging, including plain films, CT scans, and ultrasounds, independently reviewed by me, and interpretations confirmed via formal radiology reads.  ED MD interpretation:   CXR: Clear DG Neck: No FB  Official radiology report(s): DG Neck Soft Tissue  Result Date: 09/01/2021 CLINICAL DATA:  Swallowed foreign body EXAM: NECK SOFT TISSUES - 1+ VIEW COMPARISON:  None. FINDINGS: There is no evidence of retropharyngeal soft tissue swelling or epiglottic enlargement. The cervical airway is unremarkable and no radio-opaque foreign body identified. IMPRESSION: Negative. Electronically Signed   By: 13/12/2020 M.D.   On: 09/01/2021 19:08   DG Chest 2 View  Result Date: 09/01/2021 CLINICAL DATA:  Question swallowed foreign body EXAM: CHEST - 2 VIEW COMPARISON:  None. FINDINGS: Cardiac and mediastinal contours normal. Lungs are well aerated and clear. No radiopaque foreign body. Thoracic dextroscoliosis. Disc degeneration and  Schmorl's node at multiple levels in the mid lower thoracic spine. IMPRESSION: No active cardiopulmonary disease. Electronically Signed   By: 13/12/2020 M.D.   On: 09/01/2021 19:09    ____________________________________________  PROCEDURES   Procedure(s) performed (including Critical Care):  Procedures  ____________________________________________  INITIAL IMPRESSION / MDM / ASSESSMENT AND PLAN / ED COURSE  As part of my medical decision making, I reviewed the following data within the electronic MEDICAL RECORD NUMBER Nursing notes reviewed and incorporated, Old chart reviewed, Notes from prior ED visits, and [place] Controlled Substance Database       *Lindsay West was evaluated in Emergency  Department on 09/01/2021 for the symptoms described in the history of present illness. She was evaluated in the context of the global COVID-19 pandemic, which necessitated consideration that the patient might be at risk for infection with the SARS-CoV-2 virus that causes COVID-19. Institutional protocols and algorithms that pertain to the evaluation of patients at risk for COVID-19 are in a state of rapid change based on information released by regulatory bodies including the CDC and federal and state organizations. These policies and algorithms were followed during the patient's care in the ED.  Some ED evaluations and interventions may be delayed as a result of limited staffing during the pandemic.*     Medical Decision Making: Well-appearing 30 year old female here with foreign body sensation in her mid to lower esophagus, after eating a chicken bone yesterday.  She is able to tolerate p.o. intake, both solid and liquid.  Vital signs are stable.  Chest x-ray and plain films of the neck obtained, reviewed, showed no evidence of foreign body.  She has no symptoms to suggest esophageal perforation and is hemodynamically stable.  No crepitance or signs of airway injury.  She has had no cough or signs to suggest  aspiration.  Given that she is able to tolerate p.o. without difficulty, including solid food, I do not suspect ongoing impaction.  I suspect she could have an esophageal abrasion with subsequent edema.  Will treat with antacids, Maalox as needed, and good return precautions with GI follow-up if symptoms do not resolve.  ____________________________________________  FINAL CLINICAL IMPRESSION(S) / ED DIAGNOSES  Final diagnoses:  Globus sensation  Abrasion of esophagus, initial encounter     MEDICATIONS GIVEN DURING THIS VISIT:  Medications  alum & mag hydroxide-simeth (MAALOX/MYLANTA) 200-200-20 MG/5ML suspension 30 mL (30 mLs Oral Given 09/01/21 2027)    And  lidocaine (XYLOCAINE) 2 % viscous mouth solution 15 mL (15 mLs Oral Given 09/01/21 2027)  LORazepam (ATIVAN) tablet 1 mg (1 mg Sublingual Given 09/01/21 2026)     ED Discharge Orders     None        Note:  This document was prepared using Dragon voice recognition software and may include unintentional dictation errors.   Shaune Pollack, MD 09/01/21 2133

## 2021-09-01 NOTE — ED Provider Notes (Signed)
Emergency Medicine Provider Triage Evaluation Note  Lindsay West , a 30 y.o. female  was evaluated in triage.  Pt complains of foreign body sensation in the throat.  Patient describes a sharp pain since yesterday eating chopped chicken.  States there was some bones in the chicken and she thinks she could have swallowed a bone.  She has had a foreign body sensation in the throat since yesterday.  Able to swallow and eat but has exacerbation of throat pain every time she swallows.  She has felt nauseous today.  She is feels if she is got some mild swelling in the throat/face.  Has tried taking ibuprofen with little relief.  Pain is 2 out of 10.  No abdominal pain, chest pain or shortness of breath.  Review of Systems  Positive: Dysphagia Negative: Fevers, abdominal pain  Physical Exam  Ht 5\' 6"  (1.676 m)   Wt 72.6 kg   BMI 25.82 kg/m  Gen:   Awake, no distress   Resp:  Normal effort no respiratory distress MSK:   Moves extremities without difficulty  Other:  No significant facial or anterior neck swelling.  Medical Decision Making  Medically screening exam initiated at 6:17 PM.  Appropriate orders placed.  Lindsay West was informed that the remainder of the evaluation will be completed by another provider, this initial triage assessment does not replace that evaluation, and the importance of remaining in the ED until their evaluation is complete.  30 year old female with dysphagia after eating chopped chicken.  She is concerned about a possible bone being stuck in the throat.  She is having throat pain that is constant but exacerbated with swallowing.   26, PA-C 09/01/21 13/02/22, MD 09/02/21 [phone]

## 2021-09-01 NOTE — ED Notes (Signed)
Pt. Not in room, left prior to receiving discharge instructions.

## 2021-09-01 NOTE — Discharge Instructions (Signed)
For your esophageal abrasion:  Start pepcid twice a day for the next 7 days  I recommend Maalox/Mylanta suspension 3-4x daily as needed to help with the sensation and discomfort  Drinking ice cold beverages can help with the sensation as well  If symptoms do not improve/resolve, call GI at the number above for a follow-up

## 2021-09-01 NOTE — ED Triage Notes (Signed)
Pt to ED via POV with c/o chest pain, after swallowing some food that felt like it got lodged in her upper chest causing her to have left sided chest pain. She woke up this am and can feel that something is lodged when ever she swallows.She is able to eat and drink without difficulty.

## 2022-11-22 ENCOUNTER — Ambulatory Visit: Payer: BLUE CROSS/BLUE SHIELD | Admitting: Dermatology

## 2022-11-22 VITALS — BP 129/83 | HR 77

## 2022-11-22 DIAGNOSIS — Z79899 Other long term (current) drug therapy: Secondary | ICD-10-CM

## 2022-11-22 DIAGNOSIS — L609 Nail disorder, unspecified: Secondary | ICD-10-CM | POA: Diagnosis not present

## 2022-11-22 DIAGNOSIS — L988 Other specified disorders of the skin and subcutaneous tissue: Secondary | ICD-10-CM

## 2022-11-22 DIAGNOSIS — L639 Alopecia areata, unspecified: Secondary | ICD-10-CM | POA: Diagnosis not present

## 2022-11-22 MED ORDER — MINOXIDIL 2.5 MG PO TABS: 2.5000 mg | ORAL_TABLET | Freq: Every day | ORAL | 2 refills | Status: DC

## 2022-11-22 MED ORDER — MOMETASONE FUROATE 0.1 % EX SOLN: CUTANEOUS | 3 refills | Status: DC

## 2022-11-22 NOTE — Progress Notes (Signed)
New Patient Visit  Subjective  Lindsay West is a 32 y.o. female who presents for the following: Facial Elastosis (Face, consult for Botox), Alopecia (Scalp, 60m, pt has areas of hair loss), and check toenail (R great toenail, hx of trauma with running).  The following portions of the chart were reviewed this encounter and updated as appropriate:   Tobacco  Allergies  Meds  Problems  Med Hx  Surg Hx  Fam Hx     Review of Systems:  No other skin or systemic complaints except as noted in HPI or Assessment and Plan.  Objective  Well appearing patient in no apparent distress; mood and affect are within normal limits.  A focused examination was performed including face, scalp, right foot. Relevant physical exam findings are noted in the Assessment and Plan.  face Rhytides and volume loss.            Scalp Areas of hair loss scalp         R great toenail Toenail dystrophy R great toenail      Assessment & Plan  Elastosis of skin face Botox  units injected today - Frown complex 25 units  Discussed may add brow lift in future  Botox Injection - face Location: frown complex  Informed consent: Discussed risks (infection, pain, bleeding, bruising, swelling, allergic reaction, paralysis of nearby muscles, eyelid droop, double vision, neck weakness, difficulty breathing, headache, undesirable cosmetic result, and need for additional treatment) and benefits of the procedure, as well as the alternatives.  Informed consent was obtained.  Preparation: The area was cleansed with alcohol.  Procedure Details:  Botox was injected into the dermis with a 30-gauge needle. Pressure applied to any bleeding. Ice packs offered for swelling.  Lot Number:  [id number] Expiration:  12/2024  Total Units Injected:  25  Plan: Patient was instructed to remain upright for 4 hours. Patient was instructed to avoid massaging the face and avoid vigorous exercise for the rest of the day.  Tylenol may be used for headache.  Allow 2 weeks before returning to clinic for additional dosing as needed. Patient will call for any problems.  Alopecia areata Scalp  Alopecia areata is a chronic autoimmune condition localized to the skin which affects hair follicles and causes hair loss, most commonly in the scalp.  Cause is unknown.  Can be unpredictable, difficult to treat, and may recur.  Treatments may include topical and intralesional steroids to decrease inflammation to allow for hair regrowth.  Other treatments may include narrowband ultraviolet B light treatment; topical Squairic acid immunotherapy application; topical or oral Minoxidil; antihistamines and oral Jak inhibitors.  BP 129/83  Start Mometasone sol qd 5d/wk to aa scalp Start Minoxidil 2.5mg  1/2 po qd  Start Claritin 1 po qd Cont Nutrafol daily  Long term medication management.  Patient is using long term (months to years) prescription medication  to control their dermatologic condition.  These medications require periodic monitoring to evaluate for efficacy and side effects and may require periodic laboratory monitoring.  Topical steroids (such as triamcinolone, fluocinolone, fluocinonide, mometasone, clobetasol, halobetasol, betamethasone, hydrocortisone) can cause thinning and lightening of the skin if they are used for too long in the same area. Your physician has selected the right strength medicine for your problem and area affected on the body. Please use your medication only as directed by your physician to prevent side effects.    Doses of minoxidil for hair loss are considered 'low dose'. This is because the doses used  for hair loss are much lower than the doses which are used for conditions such as high blood pressure (hypertension). The doses used for hypertension are 10-40mg  per day.  Side effects are uncommon at the low doses (up to 2.5 mg/day) used to treat hair loss. Potential side effects, more commonly seen at  higher doses, include: Increase in hair growth (hypertrichosis) elsewhere on face and body Temporary hair shedding upon starting medication which may last up to 4 weeks Ankle swelling, fluid retention, rapid weight gain more than 5 pounds Low blood pressure and feeling lightheaded or dizzy when standing up quickly Fast or irregular heartbeat Headaches   mometasone (ELOCON) 0.1 % lotion - Scalp Apply topically as directed. Apply to aa scalp once daily 5 days a week minoxidil (LONITEN) 2.5 MG tablet - Scalp Take 1 tablet (2.5 mg total) by mouth daily.  Nail problem R great toenail Trauma vs Psoriasis vs Fungal Discussed nail culture - Qlab molecular study, pt declines, may consider in the future Discussed topical treatment, pt declines, may consider in the future  Return in about 1 month (around 12/23/2022) for Botox f/u.  I, Lindsay West, RMA, am acting as scribe for Sarina Ser, MD . Documentation: I have reviewed the above documentation for accuracy and completeness, and I agree with the above.  Sarina Ser, MD

## 2022-11-22 NOTE — Patient Instructions (Addendum)
Start Mometasone sol daily 5 days a week to affected areas of scalp Start Minoxidil 2.5mg  1/2 pill a day  Start Claritin 1 pill a day  Topical steroids (such as triamcinolone, fluocinolone, fluocinonide, mometasone, clobetasol, halobetasol, betamethasone, hydrocortisone) can cause thinning and lightening of the skin if they are used for too long in the same area. Your physician has selected the right strength medicine for your problem and area affected on the body. Please use your medication only as directed by your physician to prevent side effects.    Doses of minoxidil for hair loss are considered 'low dose'. This is because the doses used for hair loss are much lower than the doses which are used for conditions such as high blood pressure (hypertension). The doses used for hypertension are 10-40mg  per day.  Side effects are uncommon at the low doses (up to 2.5 mg/day) used to treat hair loss. Potential side effects, more commonly seen at higher doses, include: Increase in hair growth (hypertrichosis) elsewhere on face and body Temporary hair shedding upon starting medication which may last up to 4 weeks Ankle swelling, fluid retention, rapid weight gain more than 5 pounds Low blood pressure and feeling lightheaded or dizzy when standing up quickly Fast or irregular heartbeat Headaches     Due to recent changes in healthcare laws, you may see results of your pathology and/or laboratory studies on MyChart before the doctors have had a chance to review them. We understand that in some cases there may be results that are confusing or concerning to you. Please understand that not all results are received at the same time and often the doctors may need to interpret multiple results in order to provide you with the best plan of care or course of treatment. Therefore, we ask that you please give [place] 2 business days to thoroughly review all your results before contacting the office for clarification.  Should we see a critical lab result, you will be contacted sooner.   If You Need Anything After Your Visit  If you have any questions or concerns for your doctor, please call our main line at [phone] and press option 4 to reach your doctor's medical assistant. If no one answers, please leave a voicemail as directed and we will return your call as soon as possible. Messages left after 4 pm will be answered the following business day.   You may also send [place] a message via [place]. We typically respond to MyChart messages within 1-2 business days.  For prescription refills, please ask your pharmacy to contact our office. Our fax number is [phone].  If you have an urgent issue when the clinic is closed that cannot wait until the next business day, you can page your doctor at the number below.    Please note that while we do our best to be available for urgent issues outside of office hours, we are not available 24/7.   If you have an urgent issue and are unable to reach [place], you may choose to seek medical care at your doctor's office, retail clinic, urgent care center, or emergency room.  If you have a medical emergency, please immediately call 911 or go to the emergency department.  Pager Numbers  - Dr. Nehemiah Massed: [phone]  - Dr. Laurence Ferrari: [phone]  - Dr. Nicole Kindred: [phone]  In the event of inclement weather, please call our main line at [phone] for an update on the status of any delays or closures.  Dermatology Medication Tips: Please  keep the boxes that topical medications come in in order to help keep track of the instructions about where and how to use these. Pharmacies typically print the medication instructions only on the boxes and not directly on the medication tubes.   If your medication is too expensive, please contact our office at [phone] option 4 or send [place] a message through [place].   We are unable to tell what your co-pay for medications will be  in advance as this is different depending on your insurance coverage. However, we may be able to find a substitute medication at lower cost or fill out paperwork to get insurance to cover a needed medication.   If a prior authorization is required to get your medication covered by your insurance company, please allow [place] 1-2 business days to complete this process.  Drug prices often vary depending on where the prescription is filled and some pharmacies may offer cheaper prices.  The website www.goodrx.com contains coupons for medications through different pharmacies. The prices here do not account for what the cost may be with help from insurance (it may be cheaper with your insurance), but the website can give you the price if you did not use any insurance.  - You can print the associated coupon and take it with your prescription to the pharmacy.  - You may also stop by our office during regular business hours and pick up a GoodRx coupon card.  - If you need your prescription sent electronically to a different pharmacy, notify our office through [facility] or by phone at [phone] option 4.     Si Usted Necesita Algo Despus de Su Visita  Tambin puede enviarnos un mensaje a travs de Pharmacist, community. Por lo general respondemos a los mensajes de MyChart en el transcurso de 1 a 2 das hbiles.  Para renovar recetas, por favor pida a su farmacia que se ponga en contacto con nuestra oficina. Harland Dingwall de fax es [place] [phone].  Si tiene un asunto urgente cuando la clnica est cerrada y que no puede esperar hasta el siguiente da hbil, puede llamar/localizar a su doctor(a) al nmero que aparece a continuacin.   Por favor, tenga en cuenta que aunque hacemos todo lo posible para estar disponibles para asuntos urgentes fuera del horario de [place], no estamos disponibles las 24 horas del da, los 7 das de la [place].   Si tiene un problema urgente y no puede comunicarse con nosotros,  puede optar por buscar atencin mdica  en el consultorio de su doctor(a), en una clnica privada, en un centro de atencin urgente o en una sala de emergencias.  Si tiene Engineering geologist, por favor llame inmediatamente al 911 o vaya a la sala de emergencias.  Nmeros de bper  - Dr. Nehemiah Massed: [phone]  - Dra. Moye: [phone]  - Dra. Nicole Kindred: [phone]  En caso de inclemencias del [place], por favor llame a Johnsie Kindred principal al [phone] para una actualizacin sobre el [place] de cualquier retraso o cierre.  Consejos para la medicacin en dermatologa: Por favor, guarde las cajas en las que vienen los medicamentos de uso tpico para ayudarle a seguir las instrucciones sobre dnde y cmo usarlos. Las farmacias generalmente imprimen las instrucciones del medicamento slo en las cajas y no directamente en los tubos del [place].   Si su medicamento es muy caro, por favor, pngase en contacto con Zigmund Daniel llamando al [phone] y presione la opcin 4 o envenos un mensaje a travs de  MyChart.   No podemos decirle cul ser su copago por los medicamentos por adelantado ya que esto es diferente dependiendo de la cobertura de su seguro. Sin embargo, es posible que podamos encontrar un medicamento sustituto a Electrical engineer un formulario para que el seguro cubra el medicamento que se considera necesario.   Si se requiere una autorizacin previa para que su compaa de seguros [place] su medicamento, por favor permtanos de 1 a 2 das hbiles para completar este proceso.  Los precios de los medicamentos varan con frecuencia dependiendo del Environmental consultant de dnde se surte la receta y alguna farmacias pueden ofrecer precios ms baratos.  El sitio web www.goodrx.com tiene cupones para medicamentos de Airline pilot. Los precios aqu no tienen en cuenta lo que podra costar con la ayuda del seguro (puede ser ms barato con su seguro), pero el sitio web puede darle el  precio si no utiliz Research scientist (physical sciences).  - Puede imprimir el cupn correspondiente y llevarlo con su receta a la farmacia.  - Tambin puede pasar por nuestra oficina durante el horario de atencin regular y Charity fundraiser una tarjeta de cupones de GoodRx.  - Si necesita que su receta se enve electrnicamente a una farmacia diferente, informe a nuestra oficina a travs de MyChart de  o por telfono llamando al [phone] y presione la opcin 4.

## 2022-11-30 ENCOUNTER — Encounter: Payer: Self-pay | Admitting: Dermatology

## 2022-12-19 ENCOUNTER — Telehealth: Payer: Self-pay

## 2022-12-19 NOTE — Telephone Encounter (Signed)
LM on VM -calling to confirm this botox appt

## 2022-12-21 ENCOUNTER — Ambulatory Visit (INDEPENDENT_AMBULATORY_CARE_PROVIDER_SITE_OTHER): Payer: Self-pay | Admitting: Dermatology

## 2022-12-21 VITALS — BP 136/91 | HR 79

## 2022-12-21 DIAGNOSIS — L988 Other specified disorders of the skin and subcutaneous tissue: Secondary | ICD-10-CM

## 2022-12-21 NOTE — Progress Notes (Signed)
   Follow-Up Visit   Subjective  Lindsay West is a 32 y.o. female who presents for the following: Botox follow up  (Patient would like to discuss adding Botox to the forehead wrinkles as that is her greatest concern).  The following portions of the chart were reviewed this encounter and updated as appropriate:   Tobacco  Allergies  Meds  Problems  Med Hx  Surg Hx  Fam Hx     Review of Systems:  No other skin or systemic complaints except as noted in HPI or Assessment and Plan.  Objective  Well appearing patient in no apparent distress; mood and affect are within normal limits.  A focused examination was performed including the face. Relevant physical exam findings are noted in the Assessment and Plan.  Face Rhytides and volume loss.                    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 Great response in the frown complex.   Botox 5 units injected as marked:  - R brow lift x 2.5 units - L brow lift x 2.5 units     Botox Injection - Face Location: See attached image  Informed consent: Discussed risks (infection, pain, bleeding, bruising, swelling, allergic reaction, paralysis of nearby muscles, eyelid droop, double vision, neck weakness, difficulty breathing, headache, undesirable cosmetic result, and need for additional treatment) and benefits of the procedure, as well as the alternatives.  Informed consent was obtained.  Preparation: The area was cleansed with alcohol.  Procedure Details:  Botox was injected into the dermis with a 30-gauge needle. Pressure applied to any bleeding. Ice packs offered for swelling.  Lot Number:  [id number] Expiration:  03/26  Total Units Injected:  5.0  Plan: Patient was instructed to remain upright for 4 hours. Patient was instructed to avoid  massaging the face and avoid vigorous exercise for the rest of the day. Tylenol may be used for headache.  Allow 2 weeks before returning to clinic for additional dosing as needed. Patient will call for any problems.    Return in about 1 month (around 01/19/2023) for Botox follow up .  Luther Redo, CMA, am acting as scribe for Sarina Ser, MD . Documentation: I have reviewed the above documentation for accuracy and completeness, and I agree with the above.  Sarina Ser, MD

## 2022-12-21 NOTE — Patient Instructions (Signed)
Due to recent changes in healthcare laws, you may see results of your pathology and/or laboratory studies on MyChart before the doctors have had a chance to review them. We understand that in some cases there may be results that are confusing or concerning to you. Please understand that not all results are received at the same time and often the doctors may need to interpret multiple results in order to provide you with the best plan of care or course of treatment. Therefore, we ask that you please give us 2 business days to thoroughly review all your results before contacting the office for clarification. Should we see a critical lab result, you will be contacted sooner.   If You Need Anything After Your Visit  If you have any questions or concerns for your doctor, please call our main line at [phone] and press option 4 to reach your doctor's medical assistant. If no one answers, please leave a voicemail as directed and we will return your call as soon as possible. Messages left after 4 pm will be answered the following business day.   You may also send us a message via MyChart. We typically respond to MyChart messages within 1-2 business days.  For prescription refills, please ask your pharmacy to contact our office. Our fax number is [phone].  If you have an urgent issue when the clinic is closed that cannot wait until the next business day, you can page your doctor at the number below.    Please note that while we do our best to be available for urgent issues outside of office hours, we are not available 24/7.   If you have an urgent issue and are unable to reach us, you may choose to seek medical care at your doctor's office, retail clinic, urgent care center, or emergency room.  If you have a medical emergency, please immediately call 911 or go to the emergency department.  Pager Numbers  - Dr. Kowalski: [phone]  - Dr. Moye: [phone]  - Dr. Stewart:  [phone]  In the event of inclement weather, please call our main line at [phone] for an update on the status of any delays or closures.  Dermatology Medication Tips: Please keep the boxes that topical medications come in in order to help keep track of the instructions about where and how to use these. Pharmacies typically print the medication instructions only on the boxes and not directly on the medication tubes.   If your medication is too expensive, please contact our office at [phone] option 4 or send us a message through MyChart.   We are unable to tell what your co-pay for medications will be in advance as this is different depending on your insurance coverage. However, we may be able to find a substitute medication at lower cost or fill out paperwork to get insurance to cover a needed medication.   If a prior authorization is required to get your medication covered by your insurance company, please allow us 1-2 business days to complete this process.  Drug prices often vary depending on where the prescription is filled and some pharmacies may offer cheaper prices.  The website www.goodrx.com contains coupons for medications through different pharmacies. The prices here do not account for what the cost may be with help from insurance (it may be cheaper with your insurance), but the website can give you the price if you did not use any insurance.  - You can print the associated coupon and take it with   your prescription to the pharmacy.  - You may also stop by our office during regular business hours and pick up a GoodRx coupon card.  - If you need your prescription sent electronically to a different pharmacy, notify our office through [place] MyChart or by phone at [phone] option 4.     Si Usted Necesita Algo Despus de Su Visita  Tambin puede enviarnos un mensaje a travs de MyChart. Por lo general respondemos a los mensajes de MyChart en el transcurso de 1 a 2  das hbiles.  Para renovar recetas, por favor pida a su farmacia que se ponga en contacto con nuestra oficina. Nuestro nmero de fax es el [phone].  Si tiene un asunto urgente cuando la clnica est cerrada y que no puede esperar hasta el siguiente da hbil, puede llamar/localizar a su doctor(a) al nmero que aparece a continuacin.   Por favor, tenga en cuenta que aunque hacemos todo lo posible para estar disponibles para asuntos urgentes fuera del horario de oficina, no estamos disponibles las 24 horas del da, los 7 das de la semana.   Si tiene un problema urgente y no puede comunicarse con nosotros, puede optar por buscar atencin mdica  en el consultorio de su doctor(a), en una clnica privada, en un centro de atencin urgente o en una sala de emergencias.  Si tiene una emergencia mdica, por favor llame inmediatamente al 911 o vaya a la sala de emergencias.  Nmeros de bper  - Dr. Kowalski: [phone]  - Dra. Moye: [phone]  - Dra. Stewart: [phone]  En caso de inclemencias del tiempo, por favor llame a nuestra lnea principal al [phone] para una actualizacin sobre el estado de cualquier retraso o cierre.  Consejos para la medicacin en dermatologa: Por favor, guarde las cajas en las que vienen los medicamentos de uso tpico para ayudarle a seguir las instrucciones sobre dnde y cmo usarlos. Las farmacias generalmente imprimen las instrucciones del medicamento slo en las cajas y no directamente en los tubos del medicamento.   Si su medicamento es muy caro, por favor, pngase en contacto con nuestra oficina llamando al [phone] y presione la opcin 4 o envenos un mensaje a travs de MyChart.   No podemos decirle cul ser su copago por los medicamentos por adelantado ya que esto es diferente dependiendo de la cobertura de su seguro. Sin embargo, es posible que podamos encontrar un medicamento sustituto a menor costo o llenar un formulario para que el  seguro cubra el medicamento que se considera necesario.   Si se requiere una autorizacin previa para que su compaa de seguros cubra su medicamento, por favor permtanos de 1 a 2 das hbiles para completar este proceso.  Los precios de los medicamentos varan con frecuencia dependiendo del lugar de dnde se surte la receta y alguna farmacias pueden ofrecer precios ms baratos.  El sitio web www.goodrx.com tiene cupones para medicamentos de diferentes farmacias. Los precios aqu no tienen en cuenta lo que podra costar con la ayuda del seguro (puede ser ms barato con su seguro), pero el sitio web puede darle el precio si no utiliz ningn seguro.  - Puede imprimir el cupn correspondiente y llevarlo con su receta a la farmacia.  - Tambin puede pasar por nuestra oficina durante el horario de atencin regular y recoger una tarjeta de cupones de GoodRx.  - Si necesita que su receta se enve electrnicamente a una farmacia diferente, informe a nuestra oficina a travs de MyChart de [place]   o por telfono llamando al [phone] y presione la opcin 4.  

## 2022-12-27 ENCOUNTER — Encounter: Payer: Self-pay | Admitting: Dermatology

## 2023-01-25 ENCOUNTER — Ambulatory Visit (INDEPENDENT_AMBULATORY_CARE_PROVIDER_SITE_OTHER): Payer: BLUE CROSS/BLUE SHIELD | Admitting: Dermatology

## 2023-01-25 ENCOUNTER — Encounter: Payer: Self-pay | Admitting: Dermatology

## 2023-01-25 VITALS — BP 118/71

## 2023-01-25 DIAGNOSIS — L988 Other specified disorders of the skin and subcutaneous tissue: Secondary | ICD-10-CM

## 2023-01-25 NOTE — Patient Instructions (Signed)
Due to recent changes in healthcare laws, you may see results of your pathology and/or laboratory studies on MyChart before the doctors have had a chance to review them. We understand that in some cases there may be results that are confusing or concerning to you. Please understand that not all results are received at the same time and often the doctors may need to interpret multiple results in order to provide you with the best plan of care or course of treatment. Therefore, we ask that you please give us 2 business days to thoroughly review all your results before contacting the office for clarification. Should we see a critical lab result, you will be contacted sooner.   If You Need Anything After Your Visit  If you have any questions or concerns for your doctor, please call our main line at [phone] and press option 4 to reach your doctor's medical assistant. If no one answers, please leave a voicemail as directed and we will return your call as soon as possible. Messages left after 4 pm will be answered the following business day.   You may also send us a message via MyChart. We typically respond to MyChart messages within 1-2 business days.  For prescription refills, please ask your pharmacy to contact our office. Our fax number is [phone].  If you have an urgent issue when the clinic is closed that cannot wait until the next business day, you can page your doctor at the number below.    Please note that while we do our best to be available for urgent issues outside of office hours, we are not available 24/7.   If you have an urgent issue and are unable to reach us, you may choose to seek medical care at your doctor's office, retail clinic, urgent care center, or emergency room.  If you have a medical emergency, please immediately call 911 or go to the emergency department.  Pager Numbers  - Dr. Kowalski: [phone]  - Dr. Moye: [phone]  - Dr. Stewart:  [phone]  In the event of inclement weather, please call our main line at [phone] for an update on the status of any delays or closures.  Dermatology Medication Tips: Please keep the boxes that topical medications come in in order to help keep track of the instructions about where and how to use these. Pharmacies typically print the medication instructions only on the boxes and not directly on the medication tubes.   If your medication is too expensive, please contact our office at [phone] option 4 or send us a message through MyChart.   We are unable to tell what your co-pay for medications will be in advance as this is different depending on your insurance coverage. However, we may be able to find a substitute medication at lower cost or fill out paperwork to get insurance to cover a needed medication.   If a prior authorization is required to get your medication covered by your insurance company, please allow us 1-2 business days to complete this process.  Drug prices often vary depending on where the prescription is filled and some pharmacies may offer cheaper prices.  The website www.goodrx.com contains coupons for medications through different pharmacies. The prices here do not account for what the cost may be with help from insurance (it may be cheaper with your insurance), but the website can give you the price if you did not use any insurance.  - You can print the associated coupon and take it with   your prescription to the pharmacy.  - You may also stop by our office during regular business hours and pick up a GoodRx coupon card.  - If you need your prescription sent electronically to a different pharmacy, notify our office through [facility] MyChart or by phone at [phone] option 4.     Si Usted Necesita Algo Despus de Su Visita  Tambin puede enviarnos un mensaje a travs de MyChart. Por lo general respondemos a los mensajes de MyChart en el transcurso de 1 a 2  das hbiles.  Para renovar recetas, por favor pida a su farmacia que se ponga en contacto con nuestra oficina. Nuestro nmero de fax es el [phone].  Si tiene un asunto urgente cuando la clnica est cerrada y que no puede esperar hasta el siguiente da hbil, puede llamar/localizar a su doctor(a) al nmero que aparece a continuacin.   Por favor, tenga en cuenta que aunque hacemos todo lo posible para estar disponibles para asuntos urgentes fuera del horario de oficina, no estamos disponibles las 24 horas del da, los 7 das de la semana.   Si tiene un problema urgente y no puede comunicarse con nosotros, puede optar por buscar atencin mdica  en el consultorio de su doctor(a), en una clnica privada, en un centro de atencin urgente o en una sala de emergencias.  Si tiene una emergencia mdica, por favor llame inmediatamente al 911 o vaya a la sala de emergencias.  Nmeros de bper  - Dr. Kowalski: [phone]  - Dra. Moye: [phone]  - Dra. Stewart: [phone]  En caso de inclemencias del tiempo, por favor llame a nuestra lnea principal al [phone] para una actualizacin sobre el estado de cualquier retraso o cierre.  Consejos para la medicacin en dermatologa: Por favor, guarde las cajas en las que vienen los medicamentos de uso tpico para ayudarle a seguir las instrucciones sobre dnde y cmo usarlos. Las farmacias generalmente imprimen las instrucciones del medicamento slo en las cajas y no directamente en los tubos del medicamento.   Si su medicamento es muy caro, por favor, pngase en contacto con nuestra oficina llamando al [phone] y presione la opcin 4 o envenos un mensaje a travs de MyChart.   No podemos decirle cul ser su copago por los medicamentos por adelantado ya que esto es diferente dependiendo de la cobertura de su seguro. Sin embargo, es posible que podamos encontrar un medicamento sustituto a menor costo o llenar un formulario para que el  seguro cubra el medicamento que se considera necesario.   Si se requiere una autorizacin previa para que su compaa de seguros cubra su medicamento, por favor permtanos de 1 a 2 das hbiles para completar este proceso.  Los precios de los medicamentos varan con frecuencia dependiendo del lugar de dnde se surte la receta y alguna farmacias pueden ofrecer precios ms baratos.  El sitio web www.goodrx.com tiene cupones para medicamentos de diferentes farmacias. Los precios aqu no tienen en cuenta lo que podra costar con la ayuda del seguro (puede ser ms barato con su seguro), pero el sitio web puede darle el precio si no utiliz ningn seguro.  - Puede imprimir el cupn correspondiente y llevarlo con su receta a la farmacia.  - Tambin puede pasar por nuestra oficina durante el horario de atencin regular y recoger una tarjeta de cupones de GoodRx.  - Si necesita que su receta se enve electrnicamente a una farmacia diferente, informe a nuestra oficina a travs de MyChart de [place]   o por telfono llamando al [phone] y presione la opcin 4.  

## 2023-01-25 NOTE — Progress Notes (Signed)
   Follow-Up Visit   Subjective  Lindsay West is a 32 y.o. female who presents for the following: Botox for facial elastosis  The following portions of the chart were reviewed this encounter and updated as appropriate: medications, allergies, medical history  Review of Systems:  No other skin or systemic complaints except as noted in HPI or Assessment and Plan.  Objective  Well appearing patient in no apparent distress; mood and affect are within normal limits.  A focused examination was performed of the face.  Relevant physical exam findings are noted in the Assessment and Plan.          Assessment & Plan    Facial Elastosis  Location: Face - 6.25 units total - 1.25 units to frown complex 3.5 cm above brown in midline forehead - 2.5 units each side to brow lift  Informed consent: Discussed risks (infection, pain, bleeding, bruising, swelling, allergic reaction, paralysis of nearby muscles, eyelid droop, double vision, neck weakness, difficulty breathing, headache, undesirable cosmetic result, and need for additional treatment) and benefits of the procedure, as well as the alternatives.  Informed consent was obtained.  Preparation: The area was cleansed with alcohol.  Procedure Details:  Botox was injected into the dermis with a 30-gauge needle. Pressure applied to any bleeding. Ice packs offered for swelling.  Lot Number:  [id number] Expiration:  12/2024  Total Units Injected:  6.25  Plan: Tylenol may be used for headache.  Allow 2 weeks before returning to clinic for additional dosing as needed. Patient will call for any problems.  Return in about 2 years (around 01/24/2025) for Botox.  I, Ashok Cordia, CMA, am acting as scribe for Sarina Ser, MD .  Documentation: I have reviewed the above documentation for accuracy and completeness, and I agree with the above.  Sarina Ser, MD

## 2023-03-21 ENCOUNTER — Ambulatory Visit (INDEPENDENT_AMBULATORY_CARE_PROVIDER_SITE_OTHER): Payer: Self-pay | Admitting: Dermatology

## 2023-03-21 VITALS — BP 120/80 | HR 83

## 2023-03-21 DIAGNOSIS — L988 Other specified disorders of the skin and subcutaneous tissue: Secondary | ICD-10-CM

## 2023-03-21 NOTE — Progress Notes (Signed)
   Follow-Up Visit   Subjective  Lindsay West is a 32 y.o. female who presents for the following: Botox for facial elastosis  The following portions of the chart were reviewed this encounter and updated as appropriate: medications, allergies, medical history  Review of Systems:  No other skin or systemic complaints except as noted in HPI or Assessment and Plan.  Objective  Well appearing patient in no apparent distress; mood and affect are within normal limits.  A focused examination was performed of the face.  Relevant physical exam findings are noted in the Assessment and Plan.    Assessment & Plan    Facial Elastosis  Botox 37.5 units  Frown complex 25 units  Brow lift 5 units x 2 Forehead 1.25 units x 2  Location: See attached image  Informed consent: Discussed risks (infection, pain, bleeding, bruising, swelling, allergic reaction, paralysis of nearby muscles, eyelid droop, double vision, neck weakness, difficulty breathing, headache, undesirable cosmetic result, and need for additional treatment) and benefits of the procedure, as well as the alternatives.  Informed consent was obtained.  Preparation: The area was cleansed with alcohol.  Procedure Details:  Botox was injected into the dermis with a 30-gauge needle. Pressure applied to any bleeding. Ice packs offered for swelling.  Lot Number:  [id number] Expiration:  02/28/2025  Total Units Injected:  37.50  Plan: Tylenol may be used for headache.  Allow 2 weeks before returning to clinic for additional dosing as needed. Patient will call for any problems.  Return in about 4 months (around 07/22/2023) for Botox .  IAngelique Holm, CMA, am acting as scribe for Armida Sans, MD .   Documentation: I have reviewed the above documentation for accuracy and completeness, and I agree with the above.  Armida Sans, MD

## 2023-03-21 NOTE — Patient Instructions (Signed)
Due to recent changes in healthcare laws, you may see results of your pathology and/or laboratory studies on MyChart before the doctors have had a chance to review them. We understand that in some cases there may be results that are confusing or concerning to you. Please understand that not all results are received at the same time and often the doctors may need to interpret multiple results in order to provide you with the best plan of care or course of treatment. Therefore, we ask that you please give us 2 business days to thoroughly review all your results before contacting the office for clarification. Should we see a critical lab result, you will be contacted sooner.   If You Need Anything After Your Visit  If you have any questions or concerns for your doctor, please call our main line at [phone] and press option 4 to reach your doctor's medical assistant. If no one answers, please leave a voicemail as directed and we will return your call as soon as possible. Messages left after 4 pm will be answered the following business day.   You may also send us a message via MyChart. We typically respond to MyChart messages within 1-2 business days.  For prescription refills, please ask your pharmacy to contact our office. Our fax number is [phone].  If you have an urgent issue when the clinic is closed that cannot wait until the next business day, you can page your doctor at the number below.    Please note that while we do our best to be available for urgent issues outside of office hours, we are not available 24/7.   If you have an urgent issue and are unable to reach us, you may choose to seek medical care at your doctor's office, retail clinic, urgent care center, or emergency room.  If you have a medical emergency, please immediately call 911 or go to the emergency department.  Pager Numbers  - Dr. Kowalski: [phone]  - Dr. Moye: [phone]  - Dr. Stewart:  [phone]  In the event of inclement weather, please call our main line at [phone] for an update on the status of any delays or closures.  Dermatology Medication Tips: Please keep the boxes that topical medications come in in order to help keep track of the instructions about where and how to use these. Pharmacies typically print the medication instructions only on the boxes and not directly on the medication tubes.   If your medication is too expensive, please contact our office at [phone] option 4 or send us a message through MyChart.   We are unable to tell what your co-pay for medications will be in advance as this is different depending on your insurance coverage. However, we may be able to find a substitute medication at lower cost or fill out paperwork to get insurance to cover a needed medication.   If a prior authorization is required to get your medication covered by your insurance company, please allow us 1-2 business days to complete this process.  Drug prices often vary depending on where the prescription is filled and some pharmacies may offer cheaper prices.  The website www.goodrx.com contains coupons for medications through different pharmacies. The prices here do not account for what the cost may be with help from insurance (it may be cheaper with your insurance), but the website can give you the price if you did not use any insurance.  - You can print the associated coupon and take it with   your prescription to the pharmacy.  - You may also stop by our office during regular business hours and pick up a GoodRx coupon card.  - If you need your prescription sent electronically to a different pharmacy, notify our office through [facility] MyChart or by phone at [phone] option 4.     Si Usted Necesita Algo Despus de Su Visita  Tambin puede enviarnos un mensaje a travs de MyChart. Por lo general respondemos a los mensajes de MyChart en el transcurso de 1 a 2  das hbiles.  Para renovar recetas, por favor pida a su farmacia que se ponga en contacto con nuestra oficina. Nuestro nmero de fax es el [phone].  Si tiene un asunto urgente cuando la clnica est cerrada y que no puede esperar hasta el siguiente da hbil, puede llamar/localizar a su doctor(a) al nmero que aparece a continuacin.   Por favor, tenga en cuenta que aunque hacemos todo lo posible para estar disponibles para asuntos urgentes fuera del horario de oficina, no estamos disponibles las 24 horas del da, los 7 das de la semana.   Si tiene un problema urgente y no puede comunicarse con nosotros, puede optar por buscar atencin mdica  en el consultorio de su doctor(a), en una clnica privada, en un centro de atencin urgente o en una sala de emergencias.  Si tiene una emergencia mdica, por favor llame inmediatamente al 911 o vaya a la sala de emergencias.  Nmeros de bper  - Dr. Kowalski: [phone]  - Dra. Moye: [phone]  - Dra. Stewart: [phone]  En caso de inclemencias del tiempo, por favor llame a nuestra lnea principal al [phone] para una actualizacin sobre el estado de cualquier retraso o cierre.  Consejos para la medicacin en dermatologa: Por favor, guarde las cajas en las que vienen los medicamentos de uso tpico para ayudarle a seguir las instrucciones sobre dnde y cmo usarlos. Las farmacias generalmente imprimen las instrucciones del medicamento slo en las cajas y no directamente en los tubos del medicamento.   Si su medicamento es muy caro, por favor, pngase en contacto con nuestra oficina llamando al [phone] y presione la opcin 4 o envenos un mensaje a travs de MyChart.   No podemos decirle cul ser su copago por los medicamentos por adelantado ya que esto es diferente dependiendo de la cobertura de su seguro. Sin embargo, es posible que podamos encontrar un medicamento sustituto a menor costo o llenar un formulario para que el  seguro cubra el medicamento que se considera necesario.   Si se requiere una autorizacin previa para que su compaa de seguros cubra su medicamento, por favor permtanos de 1 a 2 das hbiles para completar este proceso.  Los precios de los medicamentos varan con frecuencia dependiendo del lugar de dnde se surte la receta y alguna farmacias pueden ofrecer precios ms baratos.  El sitio web www.goodrx.com tiene cupones para medicamentos de diferentes farmacias. Los precios aqu no tienen en cuenta lo que podra costar con la ayuda del seguro (puede ser ms barato con su seguro), pero el sitio web puede darle el precio si no utiliz ningn seguro.  - Puede imprimir el cupn correspondiente y llevarlo con su receta a la farmacia.  - Tambin puede pasar por nuestra oficina durante el horario de atencin regular y recoger una tarjeta de cupones de GoodRx.  - Si necesita que su receta se enve electrnicamente a una farmacia diferente, informe a nuestra oficina a travs de MyChart de [place]   o por telfono llamando al [phone] y presione la opcin 4.  

## 2023-03-31 ENCOUNTER — Encounter: Payer: Self-pay | Admitting: Dermatology

## 2023-04-27 IMAGING — CR DG CHEST 2V
2 series · 2 of 2 positions shown · non-contrast
Comparison: None.

CLINICAL DATA: Question swallowed foreign body

EXAM:
CHEST - 2 VIEW

[chest pa]
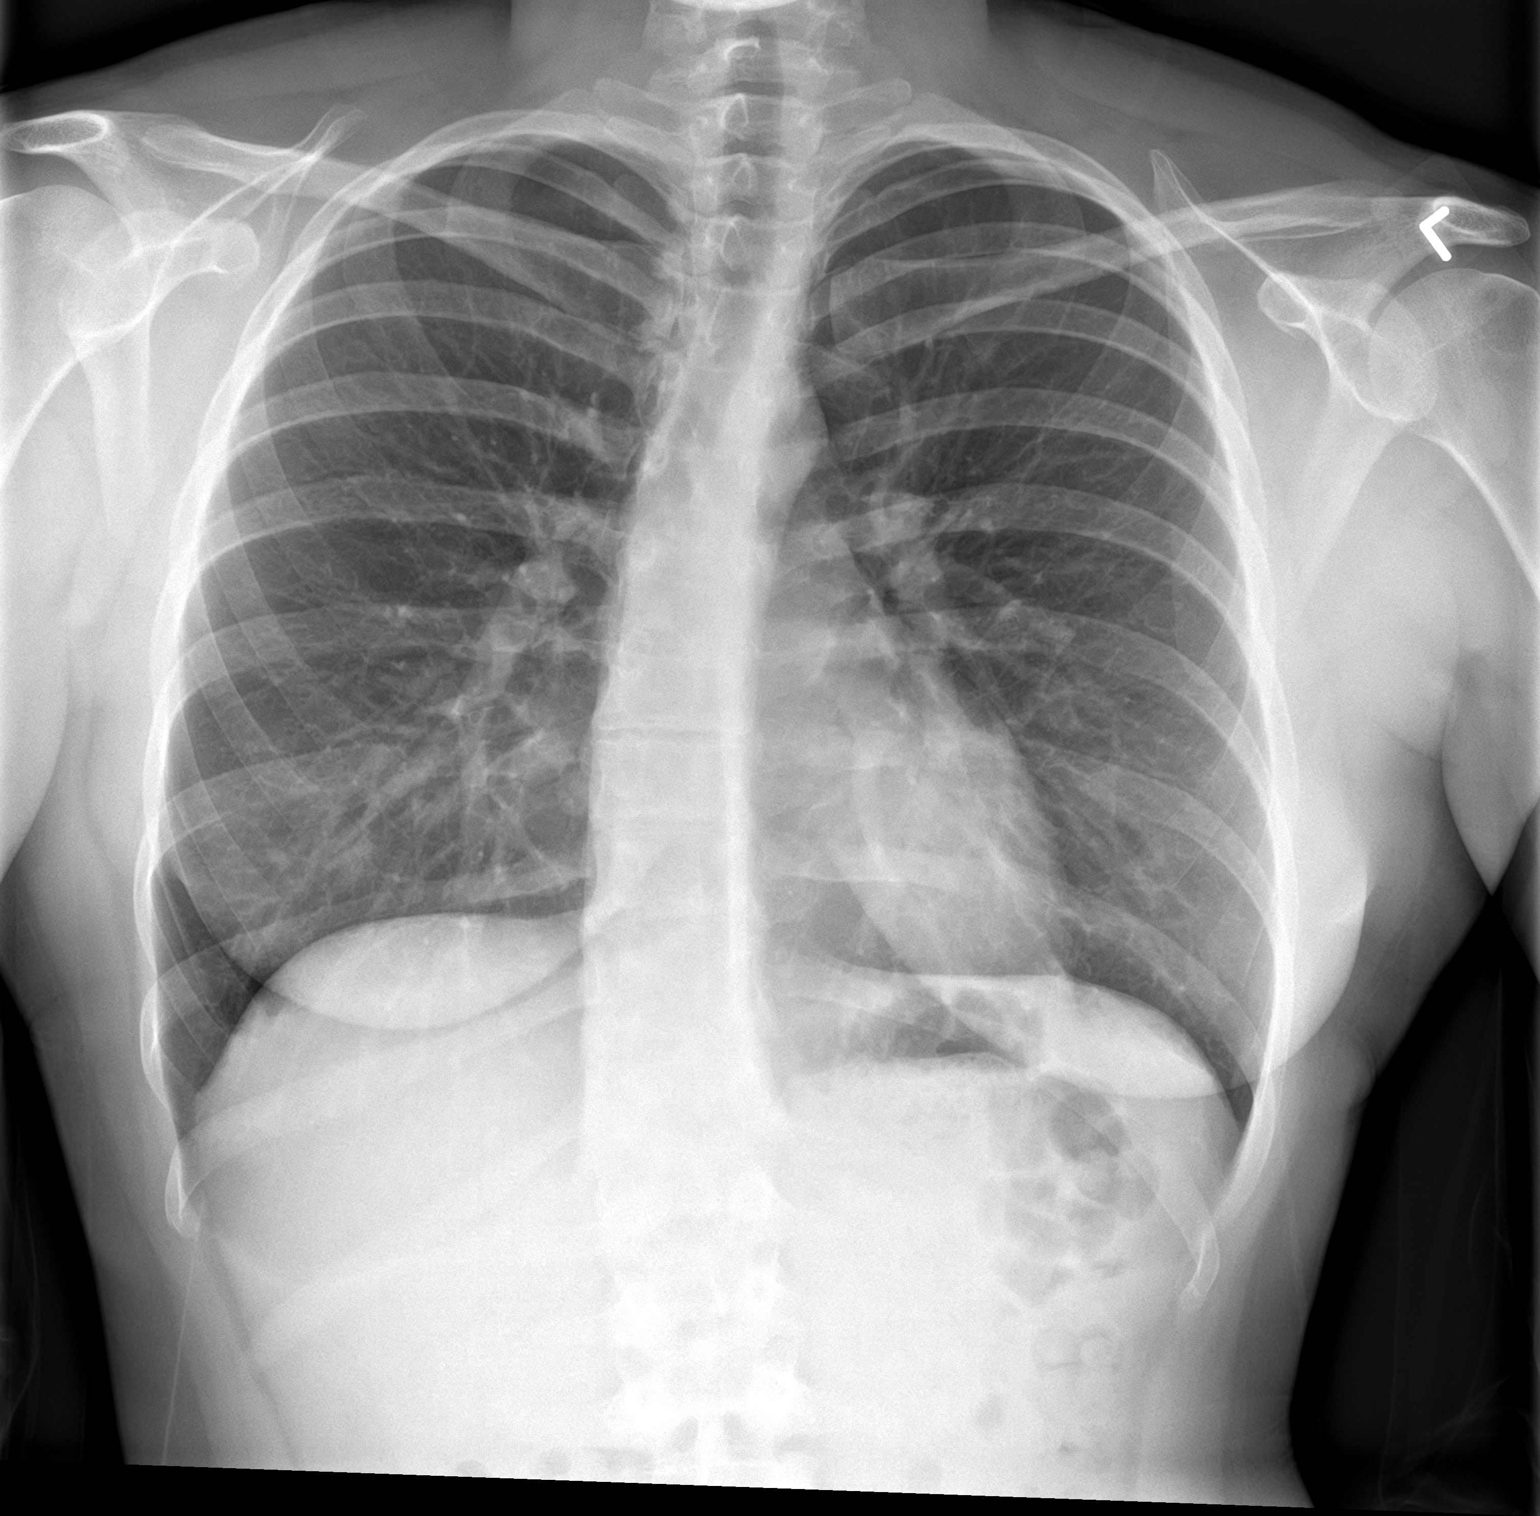

[chest lat]
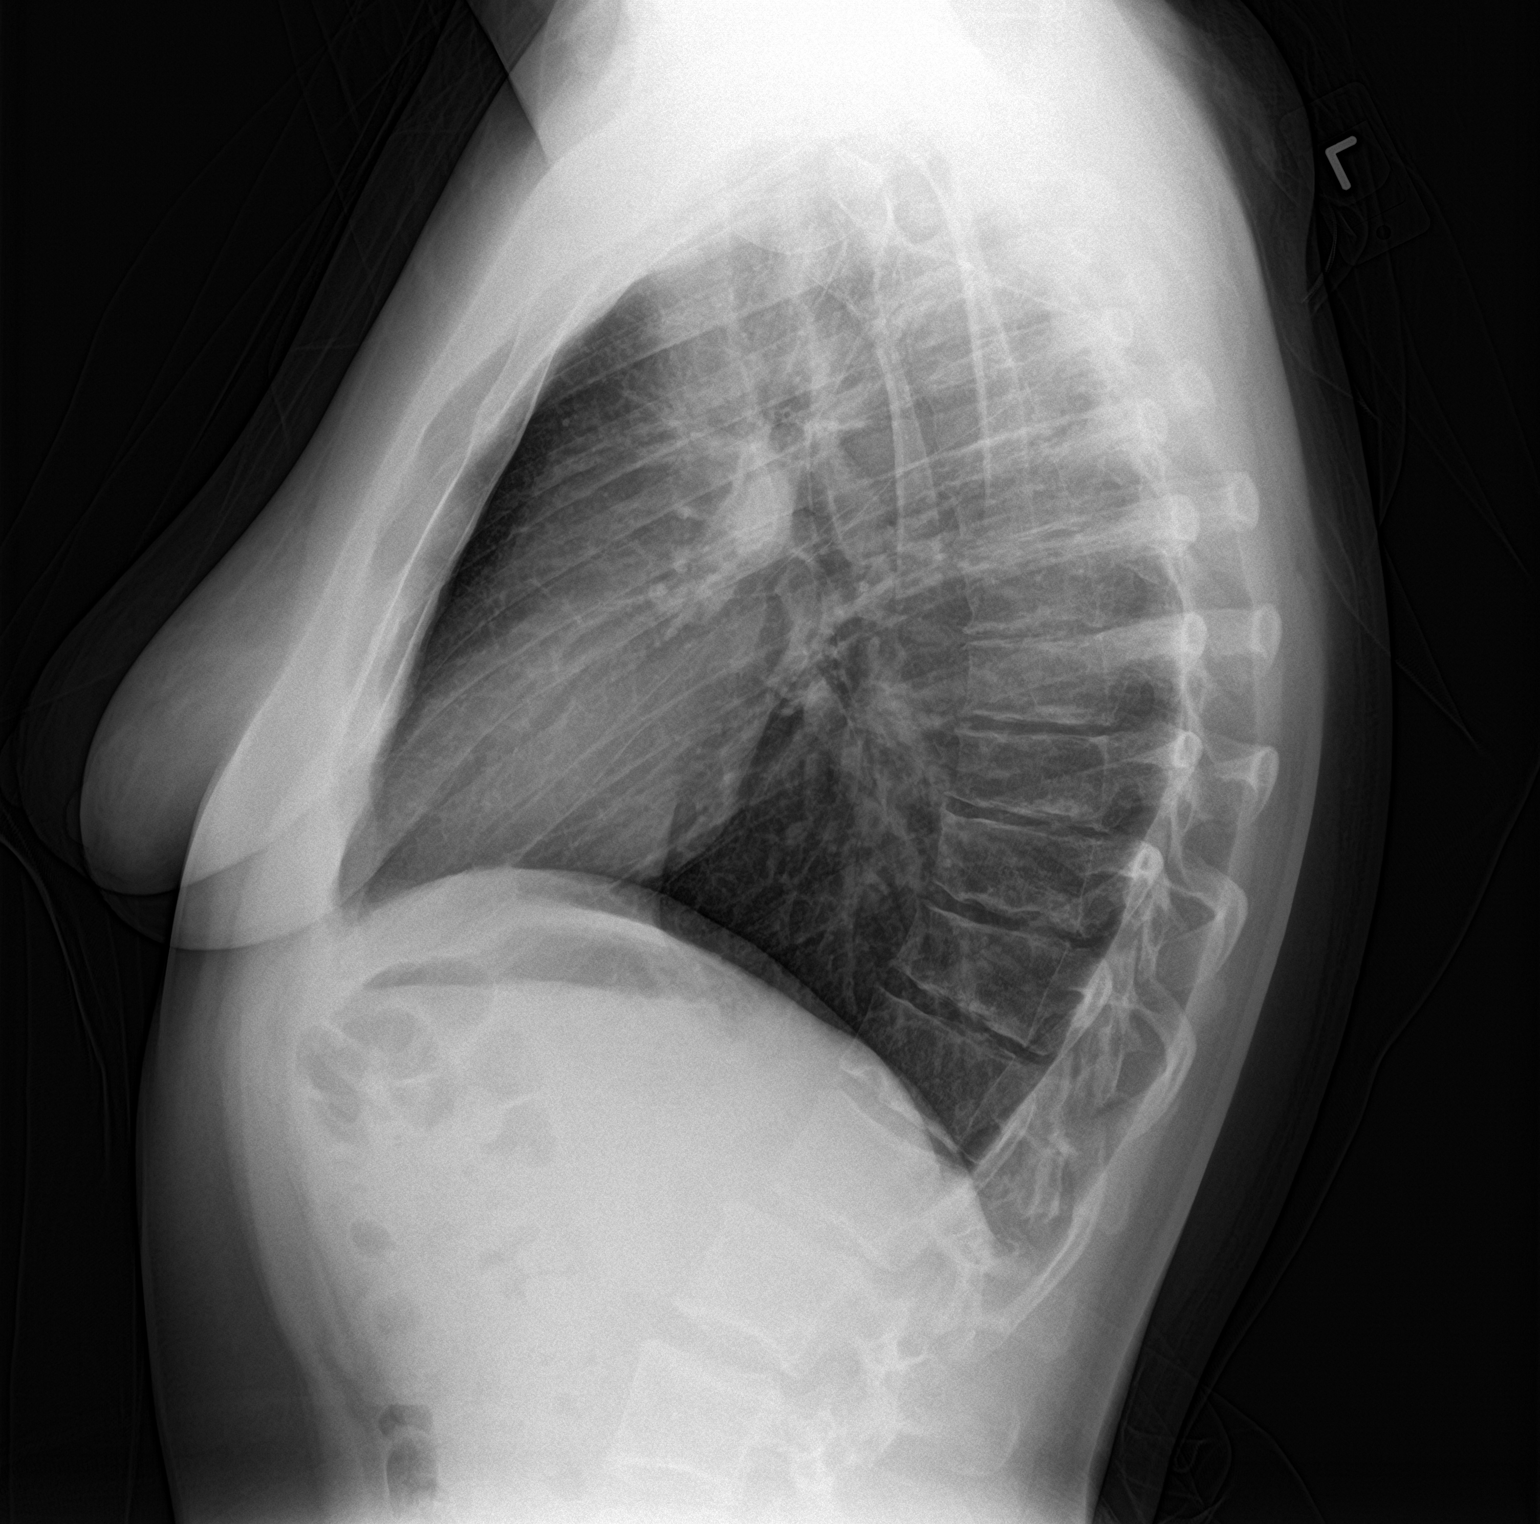

[2 of 2 positions shown; findings below may reference images not displayed]

FINDINGS: Cardiac and mediastinal contours normal. Lungs are well aerated and
clear. No radiopaque foreign body. Thoracic dextroscoliosis. Disc
degeneration and Schmorl's node at multiple levels in the mid lower
thoracic spine.
IMPRESSION: No active cardiopulmonary disease.

## 2023-07-11 ENCOUNTER — Ambulatory Visit (INDEPENDENT_AMBULATORY_CARE_PROVIDER_SITE_OTHER): Payer: Self-pay | Admitting: Dermatology

## 2023-07-11 ENCOUNTER — Encounter: Payer: Self-pay | Admitting: Dermatology

## 2023-07-11 DIAGNOSIS — L988 Other specified disorders of the skin and subcutaneous tissue: Secondary | ICD-10-CM

## 2023-07-11 NOTE — Patient Instructions (Signed)

## 2023-07-11 NOTE — Progress Notes (Signed)
    Follow-Up Visit   Subjective  Lindsay West is a 32 y.o. female who presents for the following: Botox for facial elastosis  The following portions of the chart were reviewed this encounter and updated as appropriate: medications, allergies, medical history  Review of Systems:  No other skin or systemic complaints except as noted in HPI or Assessment and Plan.  Objective  Well appearing patient in no apparent distress; mood and affect are within normal limits.  A focused examination was performed of the face.  Relevant physical exam findings are noted in the Assessment and Plan.      Assessment & Plan    Facial Elastosis  Botox 40 units injected as marked: - Frown complex 25 - Brow lift 5 units each for a total of 10 units - Forehead 5 units total - increase by 2.5 units this visit  Location: See attached image  Informed consent: Discussed risks (infection, pain, bleeding, bruising, swelling, allergic reaction, paralysis of nearby muscles, eyelid droop, double vision, neck weakness, difficulty breathing, headache, undesirable cosmetic result, and need for additional treatment) and benefits of the procedure, as well as the alternatives.  Informed consent was obtained.  Preparation: The area was cleansed with alcohol.  Procedure Details:  Botox was injected into the dermis with a 30-gauge needle. Pressure applied to any bleeding. Ice packs offered for swelling.  Lot Number:  [id number] Expiration:  07/2025  Total Units Injected:  40  Plan: Tylenol may be used for headache.  Allow 2 weeks before returning to clinic for additional dosing as needed. Patient will call for any problems.  Return in about 3 months (around 10/10/2023) for Botox injections.  Maylene Roes, CMA, am acting as scribe for Armida Sans, MD .  Documentation: I have reviewed the above documentation for accuracy and completeness, and I agree with the above.  Armida Sans, MD

## 2023-09-14 ENCOUNTER — Other Ambulatory Visit: Payer: Self-pay | Admitting: Emergency Medicine

## 2023-09-14 DIAGNOSIS — N6322 Unspecified lump in the left breast, upper inner quadrant: Secondary | ICD-10-CM

## 2023-09-25 ENCOUNTER — Ambulatory Visit
Admission: RE | Admit: 2023-09-25 | Discharge: 2023-09-25 | Disposition: A | Discharge status: Home / Self Care | Payer: BLUE CROSS/BLUE SHIELD | Source: Ambulatory Visit | Attending: Emergency Medicine | Admitting: Emergency Medicine

## 2023-09-25 ENCOUNTER — Encounter: Payer: Self-pay | Admitting: Radiology

## 2023-09-25 DIAGNOSIS — N6322 Unspecified lump in the left breast, upper inner quadrant: Secondary | ICD-10-CM | POA: Diagnosis present

## 2023-11-01 NOTE — L&D Delivery Note (Signed)
 Delivery Note At 5:13 AM a viable female was delivered via Vaginal, Spontaneous (Presentation: Right Occiput Anterior).  APGAR: 9 and 9   Placenta status: Spontaneous, Intact.  Cord: 3 vessels with the following complications: None.  Cord pH: not performed   Anesthesia: Epidural Episiotomy: None Lacerations: None Suture Repair: not applicable Est. Blood Loss (mL):  300  Mom to postpartum.  Baby to Couplet care / Skin to Skin.  Rosaline LITTIE Cobble 07/10/2024, 5:23 AM

## 2023-11-07 ENCOUNTER — Ambulatory Visit: Payer: BLUE CROSS/BLUE SHIELD | Admitting: Dermatology

## 2023-11-21 ENCOUNTER — Ambulatory Visit: Payer: BLUE CROSS/BLUE SHIELD | Admitting: Dermatology

## 2023-12-12 LAB — OB RESULTS CONSOLE HEPATITIS B SURFACE ANTIGEN: Hepatitis B Surface Ag: NEGATIVE

## 2023-12-12 LAB — OB RESULTS CONSOLE RUBELLA ANTIBODY, IGM: Rubella: NON-IMMUNE/NOT IMMUNE

## 2023-12-12 LAB — HEPATITIS C ANTIBODY: HCV Ab: NEGATIVE

## 2023-12-21 LAB — OB RESULTS CONSOLE GC/CHLAMYDIA
Chlamydia: NEGATIVE
Neisseria Gonorrhea: NEGATIVE

## 2024-04-29 LAB — OB RESULTS CONSOLE HIV ANTIBODY (ROUTINE TESTING): HIV: NONREACTIVE

## 2024-04-29 LAB — OB RESULTS CONSOLE RPR: RPR: NONREACTIVE

## 2024-06-24 LAB — OB RESULTS CONSOLE GBS: GBS: NEGATIVE

## 2024-07-09 ENCOUNTER — Encounter (HOSPITAL_COMMUNITY): Payer: Self-pay | Admitting: *Deleted

## 2024-07-09 ENCOUNTER — Inpatient Hospital Stay (HOSPITAL_COMMUNITY)
Admission: AD | Admit: 2024-07-09 | Discharge: 2024-07-11 | DRG: 807 | Disposition: A | Discharge status: Home / Self Care | Attending: Obstetrics and Gynecology | Admitting: Obstetrics and Gynecology

## 2024-07-09 DIAGNOSIS — Z833 Family history of diabetes mellitus: Secondary | ICD-10-CM

## 2024-07-09 DIAGNOSIS — Z3A39 39 weeks gestation of pregnancy: Secondary | ICD-10-CM

## 2024-07-09 NOTE — MAU Note (Signed)
 Lindsay West is a 33 y.o. at [redacted]w[redacted]d here in MAU reporting ctxs since 2015 with vag bleeding. Reports good FM and denies LOF. Was 2.5cm last Weds  LMP: na Onset of complaint: 2015 Pain score: 5 Vitals:   07/09/24 2258 07/09/24 2302  BP:  127/88  Pulse: 78   Resp: 18   Temp: 98.6 F (37 C)   SpO2: 99%      FHT: 150  Lab orders placed from triage: labor eval

## 2024-07-10 ENCOUNTER — Inpatient Hospital Stay (HOSPITAL_COMMUNITY): Admitting: Anesthesiology

## 2024-07-10 ENCOUNTER — Other Ambulatory Visit: Payer: Self-pay

## 2024-07-10 ENCOUNTER — Encounter (HOSPITAL_COMMUNITY): Payer: Self-pay | Admitting: Obstetrics and Gynecology

## 2024-07-10 DIAGNOSIS — Z833 Family history of diabetes mellitus: Secondary | ICD-10-CM | POA: Diagnosis not present

## 2024-07-10 DIAGNOSIS — Z3A39 39 weeks gestation of pregnancy: Secondary | ICD-10-CM | POA: Diagnosis not present

## 2024-07-10 DIAGNOSIS — O26893 Other specified pregnancy related conditions, third trimester: Secondary | ICD-10-CM | POA: Diagnosis present

## 2024-07-10 LAB — RPR: RPR Ser Ql: NONREACTIVE

## 2024-07-10 LAB — TYPE AND SCREEN
ABO/RH(D): A POS
Antibody Screen: NEGATIVE

## 2024-07-10 LAB — CBC
HCT: 38.8 % (ref 36.0–46.0)
Hemoglobin: 13.4 g/dL (ref 12.0–15.0)
MCH: 31.4 pg (ref 26.0–34.0)
MCHC: 34.5 g/dL (ref 30.0–36.0)
MCV: 90.9 fL (ref 80.0–100.0)
Platelets: 163 K/uL (ref 150–400)
RBC: 4.27 MIL/uL (ref 3.87–5.11)
RDW: 12.4 % (ref 11.5–15.5)
WBC: 15 K/uL — ABNORMAL HIGH (ref 4.0–10.5)
nRBC: 0 % (ref 0.0–0.2)

## 2024-07-10 MED ORDER — FLEET ENEMA RE ENEM: 1.0000 | ENEMA | RECTAL | Status: DC | PRN

## 2024-07-10 MED ORDER — LACTATED RINGERS IV SOLN
500.0000 mL | INTRAVENOUS | Status: DC | PRN
  Administered 2024-07-10: 1000 mL via INTRAVENOUS

## 2024-07-10 MED ORDER — ONDANSETRON HCL 4 MG/2ML IJ SOLN: 4.0000 mg | Freq: Four times a day (QID) | INTRAMUSCULAR | Status: DC | PRN

## 2024-07-10 MED ORDER — DIPHENHYDRAMINE HCL 50 MG/ML IJ SOLN: 12.5000 mg | INTRAMUSCULAR | Status: DC | PRN

## 2024-07-10 MED ORDER — TETANUS-DIPHTH-ACELL PERTUSSIS 5-2.5-18.5 LF-MCG/0.5 IM SUSY: 0.5000 mL | PREFILLED_SYRINGE | Freq: Once | INTRAMUSCULAR | Status: DC

## 2024-07-10 MED ORDER — DIPHENHYDRAMINE HCL 25 MG PO CAPS: 25.0000 mg | ORAL_CAPSULE | Freq: Four times a day (QID) | ORAL | Status: DC | PRN

## 2024-07-10 MED ORDER — LIDOCAINE HCL (PF) 1 % IJ SOLN: 30.0000 mL | INTRAMUSCULAR | Status: DC | PRN

## 2024-07-10 MED ORDER — OXYTOCIN-SODIUM CHLORIDE 30-0.9 UT/500ML-% IV SOLN
2.5000 [IU]/h | INTRAVENOUS | Status: DC
Start: 2024-07-10 — End: 2024-07-10
  Filled 2024-07-10: qty 500

## 2024-07-10 MED ORDER — BENZOCAINE-MENTHOL 20-0.5 % EX AERO: 1.0000 | INHALATION_SPRAY | CUTANEOUS | Status: DC | PRN

## 2024-07-10 MED ORDER — LIDOCAINE HCL (PF) 1 % IJ SOLN
INTRAMUSCULAR | Status: DC | PRN
  Administered 2024-07-10: 5 mL via EPIDURAL

## 2024-07-10 MED ORDER — LACTATED RINGERS IV SOLN: INTRAVENOUS | Status: DC

## 2024-07-10 MED ORDER — ACETAMINOPHEN 325 MG PO TABS: 650.0000 mg | ORAL_TABLET | ORAL | Status: DC | PRN

## 2024-07-10 MED ORDER — OXYCODONE-ACETAMINOPHEN 5-325 MG PO TABS: 1.0000 | ORAL_TABLET | ORAL | Status: DC | PRN

## 2024-07-10 MED ORDER — DIBUCAINE (PERIANAL) 1 % EX OINT: 1.0000 | TOPICAL_OINTMENT | CUTANEOUS | Status: DC | PRN

## 2024-07-10 MED ORDER — FENTANYL-BUPIVACAINE-NACL 0.5-0.125-0.9 MG/250ML-% EP SOLN
12.0000 mL/h | EPIDURAL | Status: DC | PRN
  Administered 2024-07-10: 12 mL/h via EPIDURAL
  Filled 2024-07-10: qty 250

## 2024-07-10 MED ORDER — SENNOSIDES-DOCUSATE SODIUM 8.6-50 MG PO TABS
2.0000 | ORAL_TABLET | Freq: Every day | ORAL | Status: DC
Start: 2024-07-11 — End: 2024-07-11
  Administered 2024-07-11: 2 via ORAL
  Filled 2024-07-10: qty 2

## 2024-07-10 MED ORDER — IBUPROFEN 600 MG PO TABS
600.0000 mg | ORAL_TABLET | Freq: Four times a day (QID) | ORAL | Status: DC
  Administered 2024-07-10 – 2024-07-11 (×5): 600 mg via ORAL
  Filled 2024-07-10 (×5): qty 1

## 2024-07-10 MED ORDER — LACTATED RINGERS IV SOLN: 500.0000 mL | Freq: Once | INTRAVENOUS | Status: DC

## 2024-07-10 MED ORDER — SIMETHICONE 80 MG PO CHEW: 80.0000 mg | CHEWABLE_TABLET | ORAL | Status: DC | PRN

## 2024-07-10 MED ORDER — EPHEDRINE 5 MG/ML INJ: 10.0000 mg | INTRAVENOUS | Status: DC | PRN

## 2024-07-10 MED ORDER — ONDANSETRON HCL 4 MG PO TABS: 4.0000 mg | ORAL_TABLET | ORAL | Status: DC | PRN

## 2024-07-10 MED ORDER — MEASLES, MUMPS & RUBELLA VAC IJ SOLR: 0.5000 mL | Freq: Once | INTRAMUSCULAR | Status: DC

## 2024-07-10 MED ORDER — PRENATAL MULTIVITAMIN CH
1.0000 | ORAL_TABLET | Freq: Every day | ORAL | Status: DC
  Administered 2024-07-10 – 2024-07-11 (×2): 1 via ORAL
  Filled 2024-07-10 (×2): qty 1

## 2024-07-10 MED ORDER — BISACODYL 10 MG RE SUPP: 10.0000 mg | Freq: Every day | RECTAL | Status: DC | PRN

## 2024-07-10 MED ORDER — ZOLPIDEM TARTRATE 5 MG PO TABS: 5.0000 mg | ORAL_TABLET | Freq: Every evening | ORAL | Status: DC | PRN

## 2024-07-10 MED ORDER — COCONUT OIL OIL: 1.0000 | TOPICAL_OIL | Status: DC | PRN

## 2024-07-10 MED ORDER — MEDROXYPROGESTERONE ACETATE 150 MG/ML IM SUSP: 150.0000 mg | INTRAMUSCULAR | Status: DC | PRN

## 2024-07-10 MED ORDER — OXYTOCIN BOLUS FROM INFUSION
333.0000 mL | Freq: Once | INTRAVENOUS | Status: AC
  Administered 2024-07-10: 333 mL via INTRAVENOUS

## 2024-07-10 MED ORDER — FLEET ENEMA RE ENEM: 1.0000 | ENEMA | Freq: Every day | RECTAL | Status: DC | PRN

## 2024-07-10 MED ORDER — OXYCODONE-ACETAMINOPHEN 5-325 MG PO TABS: 2.0000 | ORAL_TABLET | ORAL | Status: DC | PRN

## 2024-07-10 MED ORDER — SOD CITRATE-CITRIC ACID 500-334 MG/5ML PO SOLN: 30.0000 mL | ORAL | Status: DC | PRN

## 2024-07-10 MED ORDER — ONDANSETRON HCL 4 MG/2ML IJ SOLN: 4.0000 mg | INTRAMUSCULAR | Status: DC | PRN

## 2024-07-10 MED ORDER — PHENYLEPHRINE 80 MCG/ML (10ML) SYRINGE FOR IV PUSH (FOR BLOOD PRESSURE SUPPORT): 80.0000 ug | PREFILLED_SYRINGE | INTRAVENOUS | Status: DC | PRN

## 2024-07-10 MED ORDER — PHENYLEPHRINE 80 MCG/ML (10ML) SYRINGE FOR IV PUSH (FOR BLOOD PRESSURE SUPPORT)
80.0000 ug | PREFILLED_SYRINGE | INTRAVENOUS | Status: DC | PRN
  Administered 2024-07-10: 80 ug via INTRAVENOUS
  Filled 2024-07-10: qty 10

## 2024-07-10 MED ORDER — WITCH HAZEL-GLYCERIN EX PADS: 1.0000 | MEDICATED_PAD | CUTANEOUS | Status: DC | PRN

## 2024-07-10 NOTE — Anesthesia Postprocedure Evaluation (Signed)
 Anesthesia Post Note  Patient: Lindsay West  Procedure(s) Performed: AN AD HOC LABOR EPIDURAL     Patient location during evaluation: Mother Baby Anesthesia Type: Epidural Level of consciousness: awake and alert Pain management: pain level controlled Vital Signs Assessment: post-procedure vital signs reviewed and stable Respiratory status: spontaneous breathing, nonlabored ventilation and respiratory function stable Cardiovascular status: stable Postop Assessment: no headache, no backache and epidural receding Anesthetic complications: no   No notable events documented.  Last Vitals:  Vitals:   07/10/24 0852 07/10/24 1003  BP: 123/74 124/75  Pulse: 85 93  Resp:    Temp: 36.6 C 36.8 C  SpO2: 99% 100%    Last Pain:  Vitals:   07/10/24 1207  TempSrc:   PainSc: 0-No pain   Pain Goal:                Epidural/Spinal Function Cutaneous sensation: Normal sensation (07/10/24 1445)  CHERILYN SLOUGH

## 2024-07-10 NOTE — Progress Notes (Signed)
 IV Bolus started. Patient light headed after going to bathroom on [address].  Pale and diaphoretic.  Patient back to bed, supine and legs up and IV bolus started.

## 2024-07-10 NOTE — H&P (Signed)
 Lindsay West is a 33 y.o. G 6 P 2 at 71 w 2 days presents in labor - in MAU changed from 4 to 5 cm.   OB History     Gravida  6   Para  2   Term  2   Preterm      AB  2   Living  2      SAB  2   IAB      Ectopic      Multiple      Live Births  2          Past Medical History:  Diagnosis Date  . Medical history non-contributory   . Scoliosis    Past Surgical History:  Procedure Laterality Date  . hand surgeurt Right    cyst removed   Family History: family history includes Breast cancer in her paternal aunt; Cancer in her paternal aunt; Depression in her mother; Diabetes in her paternal grandmother. Social History:  reports that she has never smoked. She has never used smokeless tobacco. She reports that she does not drink alcohol and does not use drugs.     Maternal Diabetes: No Genetic Screening: Normal Maternal Ultrasounds/Referrals: Normal Fetal Ultrasounds or other Referrals:  None Maternal Substance Abuse:  No Significant Maternal Medications:  None Significant Maternal Lab Results:  None Number of Prenatal Visits:greater than 3 verified prenatal visits Maternal Vaccinations:Flu Other Comments:  None  Review of Systems Maternal Medical History:  Reason for admission: Contractions.   Prenatal complications: no prenatal complications  Dilation: 5 Effacement (%): 90 Station: -2 Exam by:: Alondra Twitty RN Blood pressure 127/88, pulse 78, temperature 98.6 F (37 C), resp. rate 18, height 5' 6 (1.676 m), weight 88.9 kg, last menstrual period 09/08/2023, SpO2 99%, unknown if currently breastfeeding. Maternal Exam:  Uterine Assessment: Contraction strength is moderate.  Contraction frequency is irregular.  Abdomen: Fetal presentation: vertex   Fetal Exam Fetal State Assessment: Category I - tracings are normal.  Physical Exam Vitals and nursing note reviewed. Exam conducted with a chaperone present.  Constitutional:      Appearance:  Normal appearance.  HENT:     Head: Normocephalic.  Cardiovascular:     Rate and Rhythm: Normal rate and regular rhythm.  Musculoskeletal:     Cervical back: Normal range of motion.  Neurological:     Mental Status: She is alert.    Prenatal labs: ABO, Rh:   Antibody:   Rubella:   RPR:    HBsAg:    HIV:    GBS:     Assessment/Plan: IUP at term Labor Anticipate NSVD   Lindsay West 07/10/2024, 1:16 AM

## 2024-07-10 NOTE — Anesthesia Preprocedure Evaluation (Signed)
 Anesthesia Evaluation  Patient identified by MRN, date of birth, ID band Patient awake    Reviewed: Allergy & Precautions, NPO status , Patient's Chart, lab work & pertinent test results  Airway Mallampati: II  TM Distance: >3 FB Neck ROM: Full    Dental no notable dental hx. (+) Teeth Intact, Dental Advisory Given   Pulmonary neg pulmonary ROS   Pulmonary exam normal breath sounds clear to auscultation       Cardiovascular negative cardio ROS Normal cardiovascular exam Rhythm:Regular Rate:Normal     Neuro/Psych negative neurological ROS  negative psych ROS   GI/Hepatic negative GI ROS, Neg liver ROS,,,  Endo/Other  negative endocrine ROS    Renal/GU negative Renal ROS     Musculoskeletal scoliosis   Abdominal   Peds  Hematology Lab Results      Component                Value               Date                      WBC                      15.0 (H)            07/10/2024                HGB                      13.4                07/10/2024                HCT                      38.8                07/10/2024                MCV                      90.9                07/10/2024                PLT                      163                 07/10/2024              Anesthesia Other Findings   Reproductive/Obstetrics (+) Pregnancy                              Anesthesia Physical Anesthesia Plan  ASA: 2  Anesthesia Plan: Epidural   Post-op Pain Management:    Induction:   PONV Risk Score and Plan:   Airway Management Planned:   Additional Equipment:   Intra-op Plan:   Post-operative Plan:   Informed Consent: I have reviewed the patients History and Physical, chart, labs and discussed the procedure including the risks, benefits and alternatives for the proposed anesthesia with the patient or authorized representative who has indicated his/her understanding and acceptance.        Plan Discussed with:   Anesthesia Plan Comments: (  39.2 wk G6P2 for LEA)        Anesthesia Quick Evaluation

## 2024-07-10 NOTE — Lactation Note (Signed)
 This note was copied from a baby's chart. Lactation Consultation Note  Patient Name: Lindsay West Date: 07/10/2024 Age:31 hours Reason for consult: Initial assessment;Term  Mom had baby on the breast when LC came into rm. Mom stated baby had just had bath and so far has been BF well when he latches. Mom stated her other 2 children had tongue ties and were unable to BF so she just pumped bottle fed them. Mom is going to try to BF this baby and see how it goes. Mom stated so far so good. No painful latches. Newborn feeding habits, STS, I&O, reviewed. Baby had good body alignment and latch looked good. Mom encouraged to feed baby 8-12 times/24 hours and with feeding cues.  Encouraged mom to call for Lactation tonight if needed. Praised mom for good feeding.   Maternal Data Does the patient have breastfeeding experience prior to this delivery?: Yes How long did the patient breastfeed?: pumped 4 months to her now 31 yr old and pumped 10 months to her now 40 hr old  Feeding    LATCH Score Latch: Grasps breast easily, tongue down, lips flanged, rhythmical sucking.  Audible Swallowing: A few with stimulation  Type of Nipple: Everted at rest and after stimulation  Comfort (Breast/Nipple): Soft / non-tender  Hold (Positioning): No assistance needed to correctly position infant at breast.  LATCH Score: 9   Lactation Tools Discussed/Used    Interventions Interventions: Breast feeding basics reviewed;Skin to skin;Education;LC Services brochure  Discharge Pump: DEBP  Consult Status Consult Status: Follow-up Date: 07/11/24 Follow-up type: In-patient    Kreg Earhart G 07/10/2024, 8:37 PM

## 2024-07-10 NOTE — Anesthesia Procedure Notes (Signed)
 Epidural Patient location during procedure: OB Start time: 07/10/2024 4:26 AM End time: 07/10/2024 4:40 AM  Staffing Anesthesiologist: Jefm Garnette LABOR, MD Performed: anesthesiologist   Preanesthetic Checklist Completed: patient identified, IV checked, site marked, risks and benefits discussed, surgical consent, monitors and equipment checked, pre-op evaluation and timeout performed  Epidural Patient position: sitting Prep: DuraPrep and site prepped and draped Patient monitoring: continuous pulse ox and blood pressure Approach: midline Location: L3-L4 Injection technique: LOR air  Needle:  Needle type: Tuohy  Needle gauge: 17 G Needle length: 9 cm and 9 Needle insertion depth: 7 cm Catheter type: closed end flexible Catheter size: 19 Gauge Catheter at skin depth: 12 cm Test dose: negative  Assessment Events: blood not aspirated, no cerebrospinal fluid, injection not painful, no injection resistance, no paresthesia and negative IV test  Additional Notes Patient identified. Risks/Benefits/Options discussed with patient including but not limited to bleeding, infection, nerve damage, paralysis, failed block, incomplete pain control, headache, blood pressure changes, nausea, vomiting, reactions to medication both or allergic, itching and postpartum back pain. Confirmed with bedside nurse the patient's most recent platelet count. Confirmed with patient that they are not currently taking any anticoagulation, have any bleeding history or any family history of bleeding disorders. Patient expressed understanding and wished to proceed. All questions were answered. Sterile technique was used throughout the entire procedure. Please see nursing notes for vital signs. Test dose was given through epidural needle and negative prior to continuing to dose epidural or start infusion. Warning signs of high block given to the patient including shortness of breath, tingling/numbness in hands, complete  motor block, or any concerning symptoms with instructions to call for help. Patient was given instructions on fall risk and not to get out of bed. All questions and concerns addressed with instructions to call with any issues.  1 Attempt (S) . Patient tolerated procedure well.

## 2024-07-11 LAB — CBC
HCT: 31.8 % — ABNORMAL LOW (ref 36.0–46.0)
Hemoglobin: 11 g/dL — ABNORMAL LOW (ref 12.0–15.0)
MCH: 31.9 pg (ref 26.0–34.0)
MCHC: 34.6 g/dL (ref 30.0–36.0)
MCV: 92.2 fL (ref 80.0–100.0)
Platelets: 140 K/uL — ABNORMAL LOW (ref 150–400)
RBC: 3.45 MIL/uL — ABNORMAL LOW (ref 3.87–5.11)
RDW: 12.7 % (ref 11.5–15.5)
WBC: 13.1 K/uL — ABNORMAL HIGH (ref 4.0–10.5)
nRBC: 0 % (ref 0.0–0.2)

## 2024-07-11 MED ORDER — IBUPROFEN 600 MG PO TABS: 600.0000 mg | ORAL_TABLET | Freq: Four times a day (QID) | ORAL | 0 refills | Status: AC

## 2024-07-11 NOTE — Discharge Instructions (Signed)
 Call MD for T>100.4, heavy vaginal bleeding, severe abdominal pain, or respiratory distress.  Call office to schedule postpartum visit in 6 weeks.  Pelvic rest x 6 weeks.

## 2024-07-11 NOTE — Lactation Note (Addendum)
 This note was copied from a baby's chart. Lactation Consultation Note  Patient Name: Lindsay West Unijb'd Date: 07/11/2024 Age:31 hours Reason for consult: Follow-up assessment;Maternal discharge  P3, 39 wks, @ 27 hrs of life. Mom desires early discharge today. Baby on breast with LC arrival. Per mom other babies had significant tongue ties, this baby latched fair- encouraged mom to work on big mouth with baby. Coconut oil and hand pump provided to mom- discussed flange sizes to nipple only and dynamic with breast changes- variety of sizes sent home with mom. Baby fed well on right breast. Mom up to bathroom and then to finish on left breast.  Encouraged mom to keep working on big mouth latch with baby and use EBM or coconut oil after each feed. Discussed cluster feeding overnight/ early morning brings in our milk supply, shared expectations of milk coming in. Highlighted risk of engorgement. Discussed hand pump/express to soften breasts, motrin  as anti-inflammatory, and ice packs for 10-20 minutes post feed/pumping if still over-full is the best treatments for inflamed/engorged breasts.  Maternal Data Has patient been taught Hand Expression?: Yes Does the patient have breastfeeding experience prior to this delivery?: Yes How long did the patient breastfeed?: pumped 4 months to her now 31 yr old and pumped 10 months to her now 3 hr old- per mom babies had significant tongue ties-  Feeding Mother's Current Feeding Choice: Breast Milk  LATCH Score Latch: Grasps breast easily, tongue down, lips flanged, rhythmical sucking.  Audible Swallowing: Spontaneous and intermittent  Type of Nipple: Everted at rest and after stimulation  Comfort (Breast/Nipple): Soft / non-tender  Hold (Positioning): No assistance needed to correctly position infant at breast.  LATCH Score: 10   Lactation Tools Discussed/Used    Interventions Interventions: Breast feeding basics reviewed;Hand express;Breast  compression;Expressed milk;Coconut oil;Hand pump;Education;LC Services brochure;CDC milk storage guidelines  Discharge Discharge Education: Engorgement and breast care Pump: DEBP;Manual;Personal  Consult Status Consult Status: Complete Date: 07/11/24 Follow-up type: In-patient    [facility] 07/11/2024, 8:41 AM

## 2024-07-11 NOTE — Discharge Summary (Signed)
 Postpartum Discharge Summary  Patient Name: Lindsay West DOB: [birth year] MRN: [id number]  Date of admission: 07/09/2024 Delivery date:07/10/2024 Delivering provider: MAT BROWNING Date of discharge: 07/11/2024  Admitting diagnosis: Normal labor and delivery [O80] NSVD (normal spontaneous vaginal delivery) [O80] Intrauterine pregnancy: [redacted]w[redacted]d     Secondary diagnosis:  Principal Problem:   Normal labor and delivery Active Problems:   NSVD (normal spontaneous vaginal delivery)  Additional problems: n/a    Discharge diagnosis: Term Pregnancy Delivered                                              Post partum procedures:none Augmentation: N/A Complications: None  Hospital course: Onset of Labor With Vaginal Delivery      33 y.o. yo [id number] at [redacted]w[redacted]d was admitted in Active Labor on 07/09/2024. Labor course was complicated by n/a  Membrane Rupture Time/Date: 4:54 AM,07/10/2024  Delivery Method:Vaginal, Spontaneous Operative Delivery:N/A Episiotomy: None Lacerations:  None Patient had a postpartum course complicated by  n/a.  She is ambulating, tolerating a regular diet, passing flatus, and urinating well. Patient is discharged home in stable condition on 07/11/24.  Newborn Data: Birth date:07/10/2024 Birth time:5:13 AM Gender:Female Living status:Living Apgars:8 ,9  Weight:3440 g  Magnesium Sulfate received: No BMZ received: No Rhophylac:No MMR:No T-DaP:Given prenatally Flu: No RSV Vaccine received: No Transfusion:No  Immunizations received: Immunization History  Administered Date(s) Administered   MMR 01/06/2017    Physical exam  Vitals:   07/10/24 1700 07/10/24 2142 07/11/24 0116 07/11/24 0534  BP: 125/76 126/79 124/84 113/71  Pulse: 86 85 83 81  Resp: 18 18 18 16   Temp: 98 F (36.7 C) 98.4 F (36.9 C) 98.3 F (36.8 C) 98.2 F (36.8 C)  TempSrc:  Oral Oral Oral  SpO2:  99% 99% 99%  Weight:      Height:       General: alert, cooperative, and no  distress Lochia: appropriate Uterine Fundus: firm Incision: N/A DVT Evaluation: No evidence of DVT seen on physical exam. Negative Homan's sign. No cords or calf tenderness. Labs: Lab Results  Component Value Date   WBC 13.1 (H) 07/11/2024   HGB 11.0 (L) 07/11/2024   HCT 31.8 (L) 07/11/2024   MCV 92.2 07/11/2024   PLT 140 (L) 07/11/2024      Latest Ref Rng & Units 04/09/2014    5:10 PM  CMP  Glucose 70 - 99 mg/dL 78   BUN 6 - 23 mg/dL 11   Creatinine 9.49 - 1.10 mg/dL 9.17   Sodium 862 - 852 mEq/L 137   Potassium 3.7 - 5.3 mEq/L 4.0   Chloride 96 - 112 mEq/L 103   CO2 19 - 32 mEq/L 23   Calcium 8.4 - 10.5 mg/dL 9.0   Total Protein 6.0 - 8.3 g/dL 6.1   Total Bilirubin 0.3 - 1.2 mg/dL <9.7   Alkaline Phos 39 - 117 U/L 131   AST 0 - 37 U/L 25   ALT 0 - 35 U/L 27    Edinburgh Score:    07/10/2024    9:42 PM  Edinburgh Postnatal Depression Scale Screening Tool  I have been able to laugh and see the funny side of things. --   No data recorded  After visit meds:  Allergies as of 07/11/2024   No Known Allergies      Medication List  STOP taking these medications    minoxidil  2.5 MG tablet Commonly known as: LONITEN    mometasone  0.1 % lotion Commonly known as: ELOCON        TAKE these medications    ibuprofen  600 MG tablet Commonly known as: ADVIL  Take 1 tablet (600 mg total) by mouth every 6 (six) hours.   prenatal multivitamin Tabs tablet Take 1 tablet by mouth daily at 12 noon.         Discharge home in stable condition Infant Feeding: Breast Infant Disposition:home with mother Discharge instruction: per After Visit Summary and Postpartum booklet. Activity: Advance as tolerated. Pelvic rest for 6 weeks.  Diet: routine diet Future Appointments:No future appointments. Follow up Visit: 6 weeks PPV  07/11/2024 Duwaine Blumenthal, DO

## 2024-07-11 NOTE — Progress Notes (Signed)
 Post Partum Day 1 Subjective: no complaints, up ad lib, voiding, and tolerating PO.  Requests circ prior to d/c.  Objective: Blood pressure 113/71, pulse 81, temperature 98.2 F (36.8 C), temperature source Oral, resp. rate 16, height 5' 6 (1.676 m), weight 88.9 kg, last menstrual period 09/08/2023, SpO2 99%, unknown if currently breastfeeding.  Physical Exam:  General: alert, cooperative, and appears stated age Lochia: appropriate Uterine Fundus: firm Incision: n/a DVT Evaluation: No evidence of DVT seen on physical exam. Negative Homan's sign. No cords or calf tenderness.  Recent Labs    07/10/24 0331 07/11/24 0417  HGB 13.4 11.0*  HCT 38.8 31.8*    Assessment/Plan: Discharge home and Circumcision prior to discharge Circ-patient is counseled re: risk of bleeding, infection, and scarring.  All questions were answered and we will proceed.   LOS: 1 day   Lindsay Blumenthal, DO 07/11/2024, 8:45 AM

## 2024-07-22 ENCOUNTER — Inpatient Hospital Stay (HOSPITAL_COMMUNITY)

## 2024-07-22 ENCOUNTER — Inpatient Hospital Stay (HOSPITAL_COMMUNITY): Admission: RE | Admit: 2024-07-22 | Source: Home / Self Care | Admitting: Obstetrics and Gynecology

## 2024-07-22 ENCOUNTER — Telehealth (HOSPITAL_COMMUNITY): Payer: Self-pay | Admitting: *Deleted

## 2024-07-22 NOTE — Telephone Encounter (Signed)
 Attempted hospital discharge follow-up call. Left message for patient to return RN call with any questions or concerns. Allean IVAR Carton, RN, 07/22/24, [phone]

## 2025-02-25 ENCOUNTER — Ambulatory Visit: Admitting: Dermatology
